# Patient Record
Sex: Female | Born: 1935 | Race: Black or African American | Hispanic: No | Smoking: Former smoker
Health system: Southern US, Community
[De-identification: ages and names within clinical notes are randomized; demographics above are authoritative.]

## PROBLEM LIST (undated history)

## (undated) DIAGNOSIS — E7800 Pure hypercholesterolemia, unspecified: Secondary | ICD-10-CM

## (undated) DIAGNOSIS — E039 Hypothyroidism, unspecified: Secondary | ICD-10-CM

## (undated) DIAGNOSIS — R413 Other amnesia: Secondary | ICD-10-CM

## (undated) DIAGNOSIS — K219 Gastro-esophageal reflux disease without esophagitis: Secondary | ICD-10-CM

## (undated) DIAGNOSIS — I10 Essential (primary) hypertension: Secondary | ICD-10-CM

## (undated) DIAGNOSIS — F329 Major depressive disorder, single episode, unspecified: Secondary | ICD-10-CM

## (undated) DIAGNOSIS — F32A Depression, unspecified: Secondary | ICD-10-CM

---

## 1957-03-24 HISTORY — PX: TONSILLECTOMY: SUR1361

## 1973-03-24 HISTORY — PX: TUBAL LIGATION: SHX77

## 1988-03-24 HISTORY — PX: FOOT SURGERY: SHX648

## 1989-03-24 HISTORY — PX: ROTATOR CUFF REPAIR: SHX139

## 1999-03-25 HISTORY — PX: ABDOMINAL HYSTERECTOMY: SHX81

## 1999-03-25 HISTORY — PX: ROTATOR CUFF REPAIR: SHX139

## 1999-04-26 ENCOUNTER — Encounter (INDEPENDENT_AMBULATORY_CARE_PROVIDER_SITE_OTHER): Payer: Self-pay | Admitting: Specialist

## 1999-04-26 ENCOUNTER — Ambulatory Visit (HOSPITAL_COMMUNITY): Admission: RE | Admit: 1999-04-26 | Discharge: 1999-04-26 | Payer: Self-pay | Admitting: Gastroenterology

## 1999-09-16 ENCOUNTER — Encounter: Payer: Self-pay | Admitting: Obstetrics and Gynecology

## 1999-09-18 ENCOUNTER — Inpatient Hospital Stay (HOSPITAL_COMMUNITY): Admission: RE | Admit: 1999-09-18 | Discharge: 1999-09-19 | Payer: Self-pay | Admitting: Obstetrics and Gynecology

## 1999-09-18 ENCOUNTER — Encounter (INDEPENDENT_AMBULATORY_CARE_PROVIDER_SITE_OTHER): Payer: Self-pay | Admitting: Specialist

## 1999-12-20 ENCOUNTER — Encounter: Admission: RE | Admit: 1999-12-20 | Discharge: 1999-12-20 | Payer: Self-pay | Admitting: Obstetrics and Gynecology

## 1999-12-20 ENCOUNTER — Encounter: Payer: Self-pay | Admitting: Obstetrics and Gynecology

## 2001-01-25 ENCOUNTER — Encounter: Payer: Self-pay | Admitting: Obstetrics and Gynecology

## 2001-01-25 ENCOUNTER — Encounter: Admission: RE | Admit: 2001-01-25 | Discharge: 2001-01-25 | Payer: Self-pay | Admitting: Obstetrics and Gynecology

## 2001-03-09 ENCOUNTER — Encounter: Admission: RE | Admit: 2001-03-09 | Discharge: 2001-03-09 | Payer: Self-pay | Admitting: Internal Medicine

## 2001-03-09 ENCOUNTER — Encounter: Payer: Self-pay | Admitting: Internal Medicine

## 2001-03-12 ENCOUNTER — Encounter: Payer: Self-pay | Admitting: Internal Medicine

## 2001-03-12 ENCOUNTER — Encounter: Admission: RE | Admit: 2001-03-12 | Discharge: 2001-03-12 | Payer: Self-pay | Admitting: Internal Medicine

## 2002-02-08 ENCOUNTER — Encounter: Payer: Self-pay | Admitting: Obstetrics and Gynecology

## 2002-02-08 ENCOUNTER — Encounter: Admission: RE | Admit: 2002-02-08 | Discharge: 2002-02-08 | Payer: Self-pay | Admitting: Obstetrics and Gynecology

## 2002-02-23 ENCOUNTER — Encounter: Admission: RE | Admit: 2002-02-23 | Discharge: 2002-02-23 | Payer: Self-pay | Admitting: Obstetrics and Gynecology

## 2002-02-23 ENCOUNTER — Encounter: Payer: Self-pay | Admitting: Obstetrics and Gynecology

## 2003-05-01 ENCOUNTER — Encounter: Admission: RE | Admit: 2003-05-01 | Discharge: 2003-05-01 | Payer: Self-pay | Admitting: Obstetrics and Gynecology

## 2003-07-25 ENCOUNTER — Encounter (INDEPENDENT_AMBULATORY_CARE_PROVIDER_SITE_OTHER): Payer: Self-pay | Admitting: Specialist

## 2003-07-25 ENCOUNTER — Ambulatory Visit (HOSPITAL_COMMUNITY): Admission: RE | Admit: 2003-07-25 | Discharge: 2003-07-25 | Payer: Self-pay | Admitting: Gastroenterology

## 2003-08-02 ENCOUNTER — Ambulatory Visit (HOSPITAL_BASED_OUTPATIENT_CLINIC_OR_DEPARTMENT_OTHER): Admission: RE | Admit: 2003-08-02 | Discharge: 2003-08-02 | Payer: Self-pay | Admitting: Pulmonary Disease

## 2003-12-22 ENCOUNTER — Encounter (INDEPENDENT_AMBULATORY_CARE_PROVIDER_SITE_OTHER): Payer: Self-pay | Admitting: *Deleted

## 2003-12-22 ENCOUNTER — Ambulatory Visit (HOSPITAL_COMMUNITY): Admission: RE | Admit: 2003-12-22 | Discharge: 2003-12-23 | Payer: Self-pay | Admitting: Surgery

## 2004-03-11 ENCOUNTER — Ambulatory Visit: Payer: Self-pay | Admitting: Pulmonary Disease

## 2004-03-11 ENCOUNTER — Encounter: Payer: Self-pay | Admitting: Pulmonary Disease

## 2004-05-15 ENCOUNTER — Encounter: Admission: RE | Admit: 2004-05-15 | Discharge: 2004-05-15 | Payer: Self-pay | Admitting: Obstetrics and Gynecology

## 2004-05-15 ENCOUNTER — Emergency Department (HOSPITAL_COMMUNITY): Admission: EM | Admit: 2004-05-15 | Discharge: 2004-05-15 | Payer: Self-pay | Admitting: Emergency Medicine

## 2004-05-21 ENCOUNTER — Ambulatory Visit (HOSPITAL_COMMUNITY): Admission: RE | Admit: 2004-05-21 | Discharge: 2004-05-21 | Payer: Self-pay | Admitting: Orthopaedic Surgery

## 2004-05-24 ENCOUNTER — Encounter: Admission: RE | Admit: 2004-05-24 | Discharge: 2004-05-24 | Payer: Self-pay | Admitting: Obstetrics and Gynecology

## 2004-06-03 ENCOUNTER — Ambulatory Visit: Payer: Self-pay | Admitting: Pulmonary Disease

## 2005-08-25 ENCOUNTER — Encounter: Admission: RE | Admit: 2005-08-25 | Discharge: 2005-08-25 | Payer: Self-pay | Admitting: Obstetrics and Gynecology

## 2006-08-27 ENCOUNTER — Encounter: Admission: RE | Admit: 2006-08-27 | Discharge: 2006-08-27 | Payer: Self-pay | Admitting: Obstetrics and Gynecology

## 2007-08-25 ENCOUNTER — Encounter: Payer: Self-pay | Admitting: Pulmonary Disease

## 2007-09-01 ENCOUNTER — Ambulatory Visit: Payer: Self-pay | Admitting: Pulmonary Disease

## 2007-09-01 DIAGNOSIS — G4730 Sleep apnea, unspecified: Secondary | ICD-10-CM | POA: Insufficient documentation

## 2007-09-01 DIAGNOSIS — J4489 Other specified chronic obstructive pulmonary disease: Secondary | ICD-10-CM | POA: Insufficient documentation

## 2007-09-01 DIAGNOSIS — J45909 Unspecified asthma, uncomplicated: Secondary | ICD-10-CM | POA: Insufficient documentation

## 2007-09-01 DIAGNOSIS — J449 Chronic obstructive pulmonary disease, unspecified: Secondary | ICD-10-CM | POA: Insufficient documentation

## 2007-09-02 ENCOUNTER — Encounter: Admission: RE | Admit: 2007-09-02 | Discharge: 2007-09-02 | Payer: Self-pay | Admitting: Obstetrics and Gynecology

## 2007-09-14 ENCOUNTER — Telehealth (INDEPENDENT_AMBULATORY_CARE_PROVIDER_SITE_OTHER): Payer: Self-pay | Admitting: *Deleted

## 2007-09-29 ENCOUNTER — Ambulatory Visit (HOSPITAL_COMMUNITY): Admission: RE | Admit: 2007-09-29 | Discharge: 2007-09-29 | Payer: Self-pay | Admitting: Internal Medicine

## 2008-09-13 ENCOUNTER — Encounter: Admission: RE | Admit: 2008-09-13 | Discharge: 2008-09-13 | Payer: Self-pay | Admitting: Internal Medicine

## 2009-04-05 ENCOUNTER — Ambulatory Visit (HOSPITAL_COMMUNITY): Admission: RE | Admit: 2009-04-05 | Discharge: 2009-04-05 | Payer: Self-pay | Admitting: Internal Medicine

## 2010-01-03 ENCOUNTER — Encounter
Admission: RE | Admit: 2010-01-03 | Discharge: 2010-01-03 | Payer: Self-pay | Source: Home / Self Care | Admitting: Internal Medicine

## 2010-04-12 ENCOUNTER — Ambulatory Visit (HOSPITAL_COMMUNITY)
Admission: RE | Admit: 2010-04-12 | Discharge: 2010-04-12 | Payer: Self-pay | Source: Home / Self Care | Attending: Internal Medicine | Admitting: Internal Medicine

## 2010-04-13 ENCOUNTER — Encounter: Payer: Self-pay | Admitting: Obstetrics and Gynecology

## 2010-04-14 ENCOUNTER — Encounter: Payer: Self-pay | Admitting: Obstetrics and Gynecology

## 2010-08-09 NOTE — Op Note (Signed)
[facility]  Patient:    Haley Sanders, Haley Sanders                    MRN: [id number] Proc. Date: 09/18/99 Adm. Date:  [id number] Attending:  Lendon Colonel                           Operative Report  PREOPERATIVE DIAGNOSES: 1. Pelvic pain. 2. Uterine fibroids.  POSTOPERATIVE DIAGNOSES: 1. Pelvic pain. 2. Uterine fibroids.  OPERATION/PROCEDURE: Laparoscopically assisted vaginal hysterectomy, bilateral salpingo-oophorectomy.  ANESTHESIA: General endotracheal.  SURGEON: S. Kyra Manges, M.D.  ASSISTANT:  DESCRIPTION OF PROCEDURE:  The patient was placed in the lithotomy position and prepped and draped in the usual sterile fashion. The cervix was grasped and the elevator was inserted into the uterus. A transverse incision was made in the umbilicus and the abdomen was distended with carbon dioxide using a Veress needle with aspiration infusion technique. Then 2.5 L of CO2 were infused. The trocar was inserted into the abdomen and visualization of the pelvis occurred. A #5 mm trocar was placed in the midline at the pelvis. A lateral 5 mm on each side was placed under direct vision. The forceps and grasping forceps were used to elevate the infundibulopelvic ligament to visualize the uterus medial to this and transact the uterine infundibulopelvic structures, the broad ligament and round ligament. This was done on each side. The bladder flap was created, pushed downward and the uterine vesicles were coagulated. We went down below and completed the vaginal hysterectomy by incising in the vagina, and circumscribing the cervix and entering the cul-de-sac posteriorly and clamping the uterosacral cardinals and the remaining portion of the broad ligament was clamped and sutured with 0 chromic. Hemostasis was secure. The uterosacral ligaments were very carefully plicated in the midline with interrupted sutures of 0 Vicryl. The peritoneum was closed with 2-0  PDS. Following this, the vagina was closed in a vertical fashion with interrupted sutures of 0 Vicryl and a continuous suture of the 2-0 PDS. Hemostasis was secure. Gas was evacuated and the Foley catheter was draining clear urine. We then went above and irrigated the pelvis and checked all pedicles and found them to be hemostatically secure. We then closed the trocar sites with 0 Vicryl and 3-0  Vicryl. The incisions, four of them, were infiltrated with 0.5% Marcaine. The patient tolerated the procedure well. Sent to the recovery room in good condition. DD:  09/18/99 TD:  09/19/99 Job: [id number]

## 2010-08-09 NOTE — Op Note (Signed)
NAME:  Haley Sanders, Haley Sanders                         ACCOUNT NO.:  [id number]   MEDICAL RECORD NO.:  [id number]                   PATIENT TYPE:  AMB   LOCATION:  ENDO                                 FACILITY:  [facility]   PHYSICIAN:  Petra Kuba, M.D.                 DATE OF BIRTH:  1932-05-20   DATE OF PROCEDURE:  DATE OF DISCHARGE:                                 OPERATIVE REPORT   PROCEDURE:  Colonoscopy with hot biopsy.   INDICATIONS:  Patient with a history of colon polyps, due for repeat  screening.   Consent was signed after risks, benefits, methods, options thoroughly  discussed multiple times in the past.   MEDICINES USED:  Demerol 60, Versed 7.   PROCEDURE:  Rectal inspection is pertinent for external hemorrhoids, small.  Digital exam was negative.  The video pediatric adjustable colonoscope was  inserted and fairly easily advanced around the colon to the cecum.  This did  require abdominal pressure and rolling her on her back.  No abnormality was  seen on insertion.  The cecum was identified by the appendiceal orifice and  the ileocecal valve.  The prep was fairly adequate with over a liter of  washing and suctioning.  Adequate visualization was obtained.  There was  some stool adherent to the wall, which could not be washed off.  On slow  withdrawal through the colon, two hepatic flexure polyps were seen and were  each hot-biopsied x1 and put in the first container.  The scope was slowly  withdrawn.  In the distal transverse, descending, sigmoid, and rectum were a  few tiny polyps, all of which were hot-biopsied and put in the same  container.  No other abnormalities were seen as we slowly withdrew back to  the rectum.  Anorectal pull-through and retroflexion confirmed some small  hemorrhoids.  The scope was reinserted a short ways up the left side of the  colon.  Air was suctioned.  The scope removed.  Patient tolerated the  procedure well.  There was no obvious immediate  complication.   DIAGNOSES:  1. Small internal and external hemorrhoids.  2. Multiple tiny polyps and hot biopsies, rectal, sigmoid, descending,     transverse and hepatic flexure.  3. Otherwise within normal limits to the cecum.   PLAN:  Await pathology to determine future colonic screening.  Happy to see  back p.r.n., otherwise return to the care of Dr. Felipa Eth for the customary  health care management, to include yearly rectals and guaiacs.                                               Petra Kuba, M.D.   MEM/MEDQ  D:  07/25/2003  T:  07/25/2003  Job:  [id number]  cc:   Larina Earthly, M.D.  [address]  [place]  [place] [address]  Fax: [phone]

## 2010-08-09 NOTE — Discharge Summary (Signed)
[facility]  Patient:    Haley Sanders, Haley Sanders                    MRN: [id number] Adm. Date:  [id number] Disc. Date: [id number] Attending:  Lendon Colonel                           Discharge Summary  ADMISSION DIAGNOSIS:  Uterine fibroid, pelvic pressure and discomfort.  DISCHARGE DIAGNOSIS:  Same.  OPERATION:  Laparoscopic-assisted vaginal hysterectomy, bilateral salpingo-oophorectomy.  BRIEF HISTORY:  Ms. Nistler is a 77 year old female who presents for laparoscopic-assisted hysterectomy and removal of both ovaries for pelvic pain and a large retroverted uterine fibroid.  She has been evaluated for other causes of her pelvic discomfort in this evaluation, including pelvic ultrasound and GI evaluation had been negative.  LABORATORY STUDIES:  Admission hemoglobin was 14.6, MCV was 93, coagulation profile and metabolic profile was normal.  Preoperative cardiogram and chest x-ray were likewise normal.  HOSPITAL COURSE:  The patient was admitted to the hospital; underwent an uneventful vaginal hysterectomy with removal of a 200-gram uterine fibroid. Both ovaries were completely normal.  Her postoperative course was uncomplicated.  She was discharged the following day to home and office care. She was asked to call for fever or bleeding or any other problem that she would encounter.  She was given a prescription for Percocet for pain and Restoril for sleep.  CONDITION ON DISCHARGE:  Improved. DD:  09/27/99 TD:  09/27/99 Job: [id number]

## 2010-08-09 NOTE — Procedures (Signed)
Haley Sanders. [facility]  Patient:    Haley Sanders                       MRN: [id number] Proc. Date: 04/26/99 Adm. Date:  [id number] Attending:  Nelda Marseille CC:         Petra Kuba, M.D.             Ravi R. Felipa Eth, M.D.             Katherine Roan, M.D.                           Procedure Report  PROCEDURE PERFORMED:  Colonoscopy with hot and snare polypectomy.  ENDOSCOPIST:  Petra Kuba, M.D.  INDICATIONS:  Patient with multiple GI complaints, increasing and decreasing, due for colonic screening.  Family history of colon cancer.  Consent was signed after risks, benefits, methods, and options were thoroughly discussed on multiple occasions.  MEDICATIONS USED:  Demerol 50 mg, Versed 7 mg.  DESCRIPTION OF PROCEDURE:  Rectal inspection was pertinent for external hemorrhoids.  Digital exam was negative.  Video pediatric colonoscope was inserted with some difficulty due to a tortuous looping colon and difficulty holding air.  I was able to advance to the cecum.  This did require multiple abdominal pressures and rolling her on her back.  The cecum was identified by the appendiceal orifice and ileocecal valve.  On insertion only some left-sided diverticula were seen. The prep on the right side was fair.  There was some stool that could be suctioned.  Some stuck to the wall of the colon.  The scope was slowly withdrawn.  No cecal, ascending or transverse abnormalities were seen.  In the middescending, a 3 mm polyp was seen, snared.  Unfortunately, we cut through the base and the polyp was suctioned through the scope and collected in a trap and the base was hot biopsied. There was no active bleeding after the hot biopsy.  The scope was further withdrawn.  Other than some left sided diverticula, no other abnormalities were  seen except for a tiny hyperplastic appearing proximal rectal polyp which was hot biopsied x 1.  Once back in the rectum,  anorectal pull-through and retroflexion  confirmed the hemorrhoids.  The scope was then straightened, readvanced a short  ways up the sigmoid.  Air was suctioned, the scope slowly withdrawn.  The patient tolerated the procedure well.  There was no obvious immediate complication.  ENDOSCOPIC DIAGNOSIS: 1. Internal and external hemorrhoids. 2. Looping colon with some difficulty holding air and fair prep on the right side. 3. Occasional left-sided diverticula. 4. Two tiny to small polyps, hot biopsied in the rectum, snare and hot biopsied    in the descending. 5. Otherwise within normal limits to the cecum.  PLAN:  Await pathology to determine future screening.  Would probably recheck in four to five years.  GI follow-up p.r.n. or in three to four months.  If her left lower quadrant pain continues, might consider an antispasmodic next.  Will put er on one week of postpolypectomy instructions.  Will need to check her previous colonoscopy reports but possibly she would do better with a regular scope instead of the peds which was used in this case since maybe if it was stiffer we would e able to control the loops better and with pressure. DD:  04/26/99 TD:  04/26/99 Job: [id number]

## 2010-08-09 NOTE — Op Note (Signed)
NAMELUCINA, Haley Sanders NO.:  [id number]   MEDICAL RECORD NO.:  [id number]          PATIENT TYPE:  OIB   LOCATION:  2899                         FACILITY:  [facility]   PHYSICIAN:  Vanita Panda. Magnus Ivan, M.D.DATE OF BIRTH:  1932/11/12   DATE OF PROCEDURE:  05/21/2004  DATE OF DISCHARGE:                                 OPERATIVE REPORT   PREOPERATIVE DIAGNOSIS:  Right hand fifth finger transverse fracture  proximal phalanx.   POSTOPERATIVE DIAGNOSIS:  Right hand fifth finger transverse fracture  proximal phalanx.   PROCEDURE:  Open reduction, percutaneous pinning of right fifth finger  proximal phalanx fracture.   SURGEON:  Vanita Panda. Magnus Ivan, M.D.   ANESTHESIA:  Axillary block with IV sedation.   COMPLICATIONS:  None.   TOURNIQUET TIME:  1 hour 24 minutes.   INDICATIONS:  Briefly, Ms. Minahan is a right hand dominant 77 year old  female who last week sustained a fall on her outstretched right hand when  she accidentally fell.  She landed on this hand and sustained a fracture to  her right fifth finger.  This is her dominant hand.  She was reviewed in the  [facility] and placed in a splint and given followup in my clinic.  I  saw her on Friday and assessed her x-rays as well.  She was found to have a  transverse fracture close to the base of the proximal phalanx.  Because of  the unstable nature of this fracture, it was recommended that she undergo  operative intervention.  The risks and benefits were explained to her, and  she agreed to proceed with surgery.   PROCEDURE DESCRIPTION:  After axillary block was obtained, she was brought  to the operating room and placed supine on the operating table, with her  right arm on the radiolucent arm table.  A nonsterile tourniquet was placed  on the upper arm, and then the arm was prepped and draped with DuraPrep and  draped with sterile drapes.  An Esmarch was used to wrap up the arm, and the  tourniquet  was inflated to 250 mm of pressure.  The finger was assessed  fluoroscopically, and it was definitely found to be an unstable fracture.  An incision was made over the dorsum of the proximal phalanx and carried  down to the extensor tendon.  The extensor tendon was divided longitudinally  right down the center of the tendon.  The fracture was exposed and cleaned  of fracture debris.  It was then held in a reduced position, and a single  0.45 mm Kirschner wire was then placed from the tip of the proximal phalanx  at the PIP joint in a retrograde fashion across the fracture and across the  MCP joint and out the middle phalanx.  It was then pulled out of the middle  phalanx further so it was not blocking the PIP joint.  Two cross pins were  then placed which were 0.32 mm K-wires in a crossing pattern across the  fracture site, as well as the finger was put through motion and found to be  held stable.  The wound was then cleaned, and the extensor tendon was  reapproximated with a 4-0 fiber wire suture.  The skin was then closed with  interrupted 4-0 nylon sutures.  The pins were cut outside the skin but close  to the skin and pin caps were then placed.  Tourniquet was deflated, and the  fingers did pink up nicely.  She was placed in a well-padded splint and  taken to the recovery room in stable  condition.  Postoperatively, the pins will stay in place a minimum of three  weeks to allow for fracture healing.  She will then begin range of motion of  the IP joint to that finger but will hold on any TI or MCP motion until the  pins are removed.  I will follow her closely in clinic.      CYB/MEDQ  D:  05/21/2004  T:  05/22/2004  Job:  [id number]

## 2010-08-09 NOTE — Op Note (Signed)
NAMEANGLEA, Haley NO.:  [id number]   MEDICAL RECORD NO.:  [id number]          PATIENT TYPE:  OIB   LOCATION:  5727                         FACILITY:  [facility]   PHYSICIAN:  Velora Heckler, M.D.   DATE OF BIRTH:  March 30, 1932   DATE OF PROCEDURE:  DATE OF DISCHARGE:                                 OPERATIVE REPORT   DATE OF SURGERY:  December 22, 2003.   PREOPERATIVE DIAGNOSIS:  Thyroid goiter with compressive symptoms.   POSTOPERATIVE DIAGNOSIS:  Thyroid goiter with compressive symptoms.   PROCEDURE:  Total thyroidectomy.   SURGEON:  Velora Heckler, MD.   ASSISTANT:  Gita Kudo, MD.   ANESTHESIA:  General.   ESTIMATED BLOOD LOSS:  Minimal.   PREPARATION:  Betadine.   COMPLICATIONS:  None.   INDICATIONS:  The patient is a 77 year old black female referred by Dr. Larina Earthly and Dr. Corliss Skains for thyroid goiter with compressive symptoms.  The patient has had a longstanding goiter for greater than 15 years.  She  has noted a marked increase in size over the past 3 to 4 years.  She is  treated for emphysema and asthmatic symptoms by Dr. Marcos Eke.  Dyspnea,  which has progressed recently, seems to be due in large part to upper airway  obstruction.  She complains of hoarseness and chronic cough.  She has a  pressure sensation in the neck.  Radiographic studies showed a large thyroid  gland with tracheal deviation.  The patient now comes to surgery for  thyroidectomy.   DESCRIPTION OF PROCEDURE:  Procedure is done in OR #17 at the [facility].  Patient is brought to the operating room and placed in  the supine position on the operating room table.  Following administration  of general anesthesia, the patient is positioned and then prepped and draped  in the usual strict aseptic fashion.  After ascertaining that an adequate  level of anesthesia had been obtained, a Kocher incision is made with a #10  blade.  Dissection is  carried down through skin and subcutaneous tissues.  Platysma is divided with the electrocautery, skin flaps are developed  cephalad and caudad from the thyroid notch to the sternal notch, and a  Mahorner self-retaining retractor is placed for exposure.  The thyroid gland  is markedly enlarged.  There is a very large pyramidal lobe.  Left lobe is  larger than the right.  Dissection is begun on the left side.  Strap muscles  are incised in the midline.  Strap muscles are reflected to the left.  Venous tributaries are divided between small and medium ligaclips.  The  gland is gently dissected out from the surrounding structures back to the  level of the cervical spine.  The gland extends beneath the left clavicle.  It extends well around the airway and the esophagus.  Inferior venous  tributaries are ligated in continuity with 2-0 silk ties and divided.  Both  the inferior and superior parathyroid glands on the left are identified and  preserved.  The superior pole vessels  are dissected out.  They are divided  between large ligaclips.  The bulk of the upper pole vasculature is ligated  in continuity with 2-0 silk ties and medium ligaclips, and divided.  The  gland is rolled anteriorly.  Branches of the inferior thyroid artery are  divided between small ligaclips.  Recurrent laryngeal nerve is preserved.  The ligament of Berry's is transected with the electrocautery, and the gland  is rolled medially onto the anterior surface of the trachea.  A large  pyramidal lobe is dissected off the underlying thyroid cartilage using the  electrocautery for hemostasis.   Next, we turned our attention to the right side of the neck.  Again, strap  muscles are reflected laterally.  The right lobe does not go substernal, but  does extend posteriorly to the cervical spine.  The superior pole extends  quite high in the right neck.  Venous tributaries are divided between medium  ligaclips.  Inferior venous  tributaries are ligated in continuity with 2-0  silk ties and divided.  The gland is rolled anteriorly.  The superior pole  vascular is divided between medium ligaclips.  Likewise, the bulk of the  superior pole vasculature is ligated in continuity with 2-0 silk ties and  divided.  The gland is rolled further anteriorly.  Branches of the inferior  thyroid artery are divided between small ligaclips.  The superior  parathyroid gland is identified and preserved.  Recurrent laryngeal nerve is  identified and preserved.  Branches of the inferior thyroid artery are  suture ligated with 3-0 silk suture ligature, and then the remaining  vasculature divided with the electrocautery as the gland is excised  completely off the anterior trachea.  The gland is quite large.  It is  marked with a suture in the right superior pole.  It is submitted in toto to  Pathology for review.  Good hemostasis is obtained in the neck with small  ligaclips.  The neck is irrigated with warm saline.  Surgicel is placed over  the area of the recurrent laryngeal nerves and parathyroid glands  bilaterally.  Strap muscles are reapproximated in the midline with  interrupted 3-0 Vicryl sutures.  Platysma is closed with interrupted 3-0  Vicryl sutures.  The skin is closed with a running 4-0 Vicryl subcuticular  suture.  The wound is washed and dried, and Steri-Strips are applied.  Sterile dressings are applied.  The patient is awakened from anesthesia and  brought to the recovery room in stable condition.  The patient tolerated the  procedure well.       TMG/MEDQ  D:  12/22/2003  T:  12/22/2003  Job:  [id number]   cc:   Larina Earthly, M.D.  [address]  [place]  [place] [address]  Fax: [phone]   Danice Goltz, M.D. [facility]

## 2010-08-09 NOTE — H&P (Signed)
[facility]  Patient:    Haley Sanders, Haley Sanders                   MRN: [id number] Adm. Date:  09/18/99 Attending:  Katherine Roan, M.D.                         History and Physical  CHIEF COMPLAINT:  Pelvic discomfort, pelvic pressure, pelvic pain.  HISTORY OF PRESENT ILLNESS:  Haley Sanders is a 77 year old gravida 6, para 3 female, who presents for a laparoscopic-assisted vaginal hysterectomy and removal of both ovaries because of pelvic pain and a fairly large retroverted uterine fibroid. She had been followed for years, and her uterus has not changed in size.  Her discomfort has gotten increasingly severe.  She has been seen by a gastroenterologist who found no GI disease of significance.  Because of the continued discomfort, a vaginal-assisted hysterectomy with the removal of both ovaries has been recommended.  Her Pap smears are normal.  Her last menstrual period was in 1991.  She is on hormone replacement therapy consisting of an Estraderm patch and Agestin.  She has had no period since 1991.  PAST MEDICAL HISTORY:  Her other comorbidities include an elevated cholesterol,  allergies, reflux disease, asthma, and hypertension, which are all controlled at this point.  PAST SURGICAL HISTORY: 1. Tonsillectomy at age 41. 2. Tubal ligation at age 73. 3. She had an arthroscopy in her shoulder. 4. Foot surgery in 1988.  ALLERGIES:  CODEINE.  REVIEW OF SYSTEMS:   HEENT:  She wears glasses.  No decrease in visual or auditory acuity.  HEART:  No history of rheumatic fever.  No history of chest pain.  She has hypertension, but it is well-controlled at this time on Verapamil and HCTZ. She denies exertional dyspnea.  LUNGS:  She has multiple allergies and has mild emphysema and chronic bronchitis.  She is treated with Flovent.  She uses Flonase nasal spray.  No current cough or productive sputum.  She has minimal shortness of breath.  GU:  No stress  incontinence or frequent urinary tract infections.  GI:  She has irritable bowel syndrome, but no other bowel habit change.  No melena, o weight loss.  MUSCLES/BONES/JOINTS:  She has arthritis.  SOCIAL HISTORY:  She is a retired Engineer, site.  FAMILY HISTORY:  Her mother died of possible ovarian cancer.  Father died with  CVA.  She has a paternal aunt with leukemia.  A maternal uncle with colon.  She has several aunts with breast cancer.  A paternal aunt with diabetes mellitus.  PHYSICAL EXAMINATION:  VITAL SIGNS:  Weight 161 pounds, blood pressure 150/88.  HEENT:  Unremarkable.  Oropharynx is not injected.  NECK:  Supple.  Thyroid is not enlarged.  Carotid pulses equal without bruits.  Trachea is in the midline.  LUNGS:  Clear to P&A.  BREASTS:  No masses or tenderness.  HEART:  Normal sinus rhythm.  No murmurs, no heaves, thrills, rubs, or gallops.  ABDOMEN:  Soft, nontender, without masses.  Bowel sounds are normal.  No bruits are heard.  EXTREMITIES:  Good range of motion and equal pulses and reflexes.  PELVIC:  Normal vulva and vagina.  The cervix is clean.  The uterus is retroverted about twice normal size, with no adnexal masses or tenderness.  The uterus appears to be somewhat fixed.  RECTOVAGINAL:  Examination confirms.  IMPRESSION:  Pelvic pain, pelvic discomfort, uterine enlargement, and  a family history of ovarian and breast cancer.  PLAN:  A laparoscopic-assisted vaginal hysterectomy, BSO. DD:  09/18/99 TD:  09/18/99 Job: [id number]

## 2010-12-25 ENCOUNTER — Other Ambulatory Visit: Payer: Self-pay | Admitting: Internal Medicine

## 2010-12-25 DIAGNOSIS — Z1231 Encounter for screening mammogram for malignant neoplasm of breast: Secondary | ICD-10-CM

## 2011-01-15 ENCOUNTER — Ambulatory Visit
Admission: RE | Admit: 2011-01-15 | Discharge: 2011-01-15 | Disposition: A | Discharge status: Home / Self Care | Payer: Medicare Other | Source: Ambulatory Visit | Attending: Internal Medicine | Admitting: Internal Medicine

## 2011-01-15 DIAGNOSIS — Z1231 Encounter for screening mammogram for malignant neoplasm of breast: Secondary | ICD-10-CM

## 2011-01-30 ENCOUNTER — Other Ambulatory Visit: Payer: Self-pay | Admitting: Gastroenterology

## 2011-02-05 ENCOUNTER — Ambulatory Visit
Admission: RE | Admit: 2011-02-05 | Discharge: 2011-02-05 | Disposition: A | Discharge status: Home / Self Care | Payer: Medicare Other | Source: Ambulatory Visit | Attending: Gastroenterology | Admitting: Gastroenterology

## 2011-02-07 ENCOUNTER — Other Ambulatory Visit: Payer: Self-pay | Admitting: Gastroenterology

## 2011-04-04 ENCOUNTER — Other Ambulatory Visit (HOSPITAL_COMMUNITY): Payer: Self-pay | Admitting: *Deleted

## 2011-04-22 ENCOUNTER — Encounter (HOSPITAL_COMMUNITY): Payer: Self-pay

## 2011-04-23 ENCOUNTER — Encounter (HOSPITAL_COMMUNITY): Payer: Self-pay

## 2011-04-23 ENCOUNTER — Encounter (HOSPITAL_COMMUNITY)
Admission: RE | Admit: 2011-04-23 | Discharge: 2011-04-23 | Disposition: A | Discharge status: Home / Self Care | Payer: Medicare Other | Source: Ambulatory Visit | Attending: Internal Medicine | Admitting: Internal Medicine

## 2011-04-23 DIAGNOSIS — M810 Age-related osteoporosis without current pathological fracture: Secondary | ICD-10-CM | POA: Insufficient documentation

## 2011-04-23 HISTORY — DX: Major depressive disorder, single episode, unspecified: F32.9

## 2011-04-23 HISTORY — DX: Gastro-esophageal reflux disease without esophagitis: K21.9

## 2011-04-23 HISTORY — DX: Other amnesia: R41.3

## 2011-04-23 HISTORY — DX: Essential (primary) hypertension: I10

## 2011-04-23 HISTORY — DX: Pure hypercholesterolemia, unspecified: E78.00

## 2011-04-23 HISTORY — DX: Hypothyroidism, unspecified: E03.9

## 2011-04-23 HISTORY — DX: Depression, unspecified: F32.A

## 2011-04-23 MED ORDER — ZOLEDRONIC ACID 5 MG/100ML IV SOLN
5.0000 mg | Freq: Once | INTRAVENOUS | Status: AC
  Administered 2011-04-23: 5 mg via INTRAVENOUS
  Filled 2011-04-23: qty 100

## 2011-04-23 MED ORDER — SODIUM CHLORIDE 0.9 % IV SOLN
Freq: Once | INTRAVENOUS | Status: AC
  Administered 2011-04-23: 11:00:00 via INTRAVENOUS

## 2011-06-11 ENCOUNTER — Other Ambulatory Visit (HOSPITAL_COMMUNITY)
Admission: RE | Admit: 2011-06-11 | Discharge: 2011-06-11 | Disposition: A | Discharge status: Home / Self Care | Payer: Medicare Other | Source: Ambulatory Visit | Attending: Obstetrics and Gynecology | Admitting: Obstetrics and Gynecology

## 2011-06-11 ENCOUNTER — Other Ambulatory Visit: Payer: Self-pay | Admitting: Obstetrics and Gynecology

## 2011-06-11 DIAGNOSIS — Z124 Encounter for screening for malignant neoplasm of cervix: Secondary | ICD-10-CM | POA: Insufficient documentation

## 2012-02-23 ENCOUNTER — Other Ambulatory Visit: Payer: Self-pay | Admitting: Internal Medicine

## 2012-02-23 DIAGNOSIS — Z1231 Encounter for screening mammogram for malignant neoplasm of breast: Secondary | ICD-10-CM

## 2012-04-06 ENCOUNTER — Ambulatory Visit
Admission: RE | Admit: 2012-04-06 | Discharge: 2012-04-06 | Disposition: A | Discharge status: Home / Self Care | Payer: Medicare Other | Source: Ambulatory Visit | Attending: Internal Medicine | Admitting: Internal Medicine

## 2012-04-06 DIAGNOSIS — Z1231 Encounter for screening mammogram for malignant neoplasm of breast: Secondary | ICD-10-CM

## 2013-04-13 ENCOUNTER — Other Ambulatory Visit: Payer: Self-pay

## 2013-04-13 DIAGNOSIS — Z1231 Encounter for screening mammogram for malignant neoplasm of breast: Secondary | ICD-10-CM

## 2013-05-06 ENCOUNTER — Ambulatory Visit: Payer: Medicare Other

## 2013-05-18 ENCOUNTER — Ambulatory Visit
Admission: RE | Admit: 2013-05-18 | Discharge: 2013-05-18 | Disposition: A | Discharge status: Home / Self Care | Payer: Medicare Other | Source: Ambulatory Visit

## 2013-05-18 DIAGNOSIS — Z1231 Encounter for screening mammogram for malignant neoplasm of breast: Secondary | ICD-10-CM

## 2014-07-17 ENCOUNTER — Other Ambulatory Visit: Payer: Self-pay

## 2014-07-17 DIAGNOSIS — Z1231 Encounter for screening mammogram for malignant neoplasm of breast: Secondary | ICD-10-CM

## 2014-07-28 ENCOUNTER — Ambulatory Visit
Admission: RE | Admit: 2014-07-28 | Discharge: 2014-07-28 | Disposition: A | Discharge status: Home / Self Care | Payer: Medicare Other | Source: Ambulatory Visit

## 2014-07-28 DIAGNOSIS — Z1231 Encounter for screening mammogram for malignant neoplasm of breast: Secondary | ICD-10-CM

## 2015-05-15 ENCOUNTER — Telehealth: Payer: Self-pay

## 2015-05-15 NOTE — Telephone Encounter (Signed)
Wrong pt- cm

## 2015-09-04 ENCOUNTER — Other Ambulatory Visit: Payer: Self-pay | Admitting: Internal Medicine

## 2015-09-04 DIAGNOSIS — Z1231 Encounter for screening mammogram for malignant neoplasm of breast: Secondary | ICD-10-CM

## 2015-09-26 ENCOUNTER — Other Ambulatory Visit (HOSPITAL_COMMUNITY): Payer: Self-pay | Admitting: Internal Medicine

## 2015-09-26 DIAGNOSIS — I5030 Unspecified diastolic (congestive) heart failure: Secondary | ICD-10-CM

## 2015-10-09 ENCOUNTER — Ambulatory Visit
Admission: RE | Admit: 2015-10-09 | Discharge: 2015-10-09 | Disposition: A | Discharge status: Home / Self Care | Payer: Medicare Other | Source: Ambulatory Visit | Attending: Internal Medicine | Admitting: Internal Medicine

## 2015-10-09 DIAGNOSIS — Z1231 Encounter for screening mammogram for malignant neoplasm of breast: Secondary | ICD-10-CM

## 2015-10-11 ENCOUNTER — Other Ambulatory Visit: Payer: Self-pay

## 2015-10-11 ENCOUNTER — Ambulatory Visit (HOSPITAL_COMMUNITY): Payer: Medicare Other | Attending: Cardiology

## 2015-10-11 DIAGNOSIS — I5031 Acute diastolic (congestive) heart failure: Secondary | ICD-10-CM | POA: Diagnosis present

## 2015-10-11 DIAGNOSIS — I358 Other nonrheumatic aortic valve disorders: Secondary | ICD-10-CM | POA: Insufficient documentation

## 2015-10-11 DIAGNOSIS — G4733 Obstructive sleep apnea (adult) (pediatric): Secondary | ICD-10-CM | POA: Diagnosis not present

## 2015-10-11 DIAGNOSIS — I517 Cardiomegaly: Secondary | ICD-10-CM | POA: Diagnosis not present

## 2015-10-11 DIAGNOSIS — J449 Chronic obstructive pulmonary disease, unspecified: Secondary | ICD-10-CM | POA: Insufficient documentation

## 2015-10-11 DIAGNOSIS — I5030 Unspecified diastolic (congestive) heart failure: Secondary | ICD-10-CM | POA: Insufficient documentation

## 2015-12-05 ENCOUNTER — Encounter: Payer: Self-pay | Admitting: Interventional Cardiology

## 2015-12-19 ENCOUNTER — Encounter: Payer: Self-pay | Admitting: Interventional Cardiology

## 2015-12-19 ENCOUNTER — Ambulatory Visit (INDEPENDENT_AMBULATORY_CARE_PROVIDER_SITE_OTHER): Payer: Medicare Other | Admitting: Interventional Cardiology

## 2015-12-19 VITALS — BP 150/79 | HR 71 | Ht 61.0 in | Wt 175.0 lb

## 2015-12-19 DIAGNOSIS — R0602 Shortness of breath: Secondary | ICD-10-CM

## 2015-12-19 DIAGNOSIS — I10 Essential (primary) hypertension: Secondary | ICD-10-CM | POA: Insufficient documentation

## 2015-12-19 DIAGNOSIS — I519 Heart disease, unspecified: Secondary | ICD-10-CM | POA: Diagnosis not present

## 2015-12-19 DIAGNOSIS — I5189 Other ill-defined heart diseases: Secondary | ICD-10-CM

## 2015-12-19 NOTE — Progress Notes (Signed)
Cardiology Office Note   Date:  12/19/2015   ID:  Haley Sanders, DOB 01-09-1933, MRN [id number]  PCP:  Hoyle SauerAVVA,RAVISANKAR R, MD    No chief complaint on file. SHOB   Wt Readings from Last 3 Encounters:  12/19/15 175 lb (79.4 kg)  04/23/11 177 lb (80.3 kg)  09/01/07 173 lb 2.1 oz (78.5 kg)       History of Present Illness: Haley Sanders is a 82 y.o. female  Who has noted fatigue and SHOB over the past few months.  Worse after she eats and then walks up stairs.  She notes some ankle swelling, worse at the end of the day.    No trouble lying flat.  Bed in angled for GERD.   No CP.  S[facility] at the [facility].  She does not achieve breathlessness with exertion.  She walks at a slower pace.  Overall, she feels that her stamina has been decreasing.     Past Medical History:  Diagnosis Date  . Depression   . Diabetes mellitus   . GERD (gastroesophageal reflux disease)   . Hypercholesterolemia   . Hypertension   . Hypothyroidism   . Memory deficit   . Osteoporosis     Past Surgical History:  Procedure Laterality Date  . ABDOMINAL HYSTERECTOMY  2001  . FOOT SURGERY  1990   right foot  . ROTATOR CUFF REPAIR  1991   right  . ROTATOR CUFF REPAIR  2001   left side  . TONSILLECTOMY  1959  . TUBAL LIGATION  1975     Current Outpatient Prescriptions  Medication Sig Dispense Refill  . albuterol (PROVENTIL HFA;VENTOLIN HFA) 108 (90 Base) MCG/ACT inhaler Inhale 2 puffs into the lungs every 6 (six) hours as needed for wheezing or shortness of breath.     Marland Kitchen. aspirin 81 MG tablet Take 160 mg by mouth daily.    Marland Kitchen. atorvastatin (LIPITOR) 40 MG tablet Take 40 mg by mouth daily.    . Azelastine HCl 0.15 % SOLN Place 2 sprays into the nose daily.     . budesonide-formoterol (SYMBICORT) 160-4.5 MCG/ACT inhaler Inhale 2 puffs into the lungs 2 (two) times daily.    Marland Kitchen. buPROPion (ZYBAN) 150 MG 12 hr tablet Take 150 mg by mouth 2 (two) times daily.    . cyanocobalamin 100  MCG tablet Take 100 mcg by mouth daily.    Marland Kitchen. donepezil (ARICEPT) 10 MG tablet Take 10 mg by mouth at bedtime as needed (MEMORY).     Marland Kitchen. esomeprazole (NEXIUM) 40 MG capsule Take 40 mg by mouth daily before breakfast.    . fish oil-omega-3 fatty acids 1000 MG capsule Take 2 g by mouth daily.    Marland Kitchen. FLUoxetine (PROZAC) 40 MG capsule Take 40 mg by mouth daily.    Marland Kitchen. levothyroxine (SYNTHROID, LEVOTHROID) 112 MCG tablet Take 112 mcg by mouth daily.    . meloxicam (MOBIC) 7.5 MG tablet Take 7.5 mg by mouth as needed for pain.     . montelukast (SINGULAIR) 10 MG tablet Take 10 mg by mouth at bedtime.    . ONE TOUCH ULTRA TEST test strip 1 each by Other route as needed for other.     . psyllium (REGULOID) 0.52 G capsule Take 0.52 g by mouth daily.    Marland Kitchen. telmisartan-hydrochlorothiazide (MICARDIS HCT) 80-25 MG tablet Take 1 tablet by mouth daily.     . verapamil (CALAN) 120 MG tablet Take 120 mg by mouth 3 (  three) times daily.    . VESICARE 5 MG tablet Take 5 mg by mouth daily.     . vitamin E 600 UNIT capsule Take 600 Units by mouth daily.     No current facility-administered medications for this visit.     Allergies:   Codeine    Social History:  The patient  reports that she quit smoking about 27 years ago. She has a 33.00 pack-year smoking history. She has never used smokeless tobacco. She reports that she does not drink alcohol or use drugs.   Family History:  The patient's family history includes Heart attack in her father, maternal grandfather, and paternal grandmother.    ROS:  Please see the history of present illness.   Otherwise, review of systems are positive for DOE with walking stairs.   All other systems are reviewed and negative.    PHYSICAL EXAM: VS:  BP (!) 150/79   Pulse 71   Ht 5\' 1"  (1.549 m)   Wt 175 lb (79.4 kg)   BMI 33.07 kg/m  , BMI Body mass index is 33.07 kg/m. GEN: Well nourished, well developed, in no acute distress  HEENT: normal  Neck: no JVD, carotid bruits, or  masses Cardiac: RRR; no murmurs, rubs, or gallops,no edema  Respiratory:  clear to auscultation bilaterally, normal work of breathing GI: soft, nontender, nondistended, + BS MS: no deformity or atrophy  Skin: warm and dry, no rash Neuro:  Strength and sensation are intact Psych: euthymic mood, full affect   EKG:   The ekg ordered today demonstrates NSR, PRWP, no ST segment changes   Recent Labs: No results found for requested labs within last 8760 hours.   Lipid Panel No results found for: CHOL, TRIG, HDL, CHOLHDL, VLDL, LDLCALC, LDLDIRECT   Other studies Reviewed: Additional studies/ records that were reviewed today with results demonstrating: 7/17 echo.   ASSESSMENT AND PLAN:  1. Diastolic dysfunction. She appears euvolemic. Continue current medicines.  Cardiac function based on echo appears appropriate for age. No angina. No further cardiac testing planned. 2. Hypertension: I stressed the importance of blood pressure control and salt restriction to avoid fluid overload. 3. Shortness of breath: Try to increase activity, both in duration and intensity of possible. This will help her stamina. Watch for signs of fluid overload. If she did develop swelling, would have to consider changing her diuretic from HCTZ to furosemide.   Current medicines are reviewed at length with the patient today.  The patient concerns regarding her medicines were addressed.  The following changes have been made:  No change  Labs/ tests ordered today include:  No orders of the defined types were placed in this encounter.   Recommend 150 minutes/week of aerobic exercise Low fat, low carb, high fiber diet recommended  Disposition:   FU prn   Signed, Lance Muss, MD  12/19/2015 11:32 AM    [facility] [address] [place], [place], [place]  [address] Phone: [phone]; Fax: [phone]

## 2015-12-19 NOTE — Patient Instructions (Signed)
Medication Instructions:  Same-no changes  Labwork: None  Testing/Procedures: None  Follow-Up: As needed     If you need a refill on your cardiac medications before your next appointment, please call your pharmacy.   

## 2016-05-29 ENCOUNTER — Institutional Professional Consult (permissible substitution): Payer: Medicare Other | Admitting: Pulmonary Disease

## 2016-07-23 ENCOUNTER — Encounter: Payer: Self-pay | Admitting: Pulmonary Disease

## 2016-07-23 ENCOUNTER — Ambulatory Visit (INDEPENDENT_AMBULATORY_CARE_PROVIDER_SITE_OTHER): Payer: Medicare Other | Admitting: Pulmonary Disease

## 2016-07-23 VITALS — BP 118/64 | HR 65 | Ht 61.0 in | Wt 174.2 lb

## 2016-07-23 DIAGNOSIS — J4520 Mild intermittent asthma, uncomplicated: Secondary | ICD-10-CM

## 2016-07-23 DIAGNOSIS — I519 Heart disease, unspecified: Secondary | ICD-10-CM | POA: Diagnosis not present

## 2016-07-23 DIAGNOSIS — J42 Unspecified chronic bronchitis: Secondary | ICD-10-CM

## 2016-07-23 DIAGNOSIS — I5189 Other ill-defined heart diseases: Secondary | ICD-10-CM

## 2016-07-23 NOTE — Progress Notes (Signed)
Subjective:    Patient ID: Haley Sanders, female    DOB: 1932/10/12, 83 y.o.   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  She reports dyspnea on exertion gradually increasing over the last 2 years. She was diagnosed with "COPD" around then and placed on Symbicort with rescue albuterol with symptomatic relief. She smoked about 30-pack-years before she quit in 1990. Surprisingly, I see a spirometry from 2009 which does not show any evidence of airway obstruction. She denies frequent wheezing or chest colds. She underwent cardiac evaluation, echo 09/2015 showed dynamic outflow obstruction, no mention of hypertrophic cardiomyopathy, with peak gradient of 38, RVSP could not be estimated  She has moderate obstructive sleep apnea diagnosed in 2005 and is maintained on CPAP since then with a full face mask, her last machine is about 81 years old  During an office visit in 05/2016 she was noted to be hypoxic and placed on oxygen. She is now on a portable concentrator and is compliant with this during the daytime and during sleep. She's planning to travel to [place] for a high school reunion and is wondering how she can take her oxygen with her.  Surprisingly oxygen saturation remained stable on room air even on ambulation with 3 laps around the office   Significant tests/ events reviewed  Spirometry 08/2007-ratio 83, FEV1 84%, FVC 79%  Spirometry 07/2016-poor effort, ratio 83, FEV1 115%, FVC 106%-normal  Past Medical History:  Diagnosis Date  . Depression   . Diabetes mellitus   . GERD (gastroesophageal reflux disease)   . Hypercholesterolemia   . Hypertension   . Hypothyroidism   . Memory deficit   . Osteoporosis      Past Surgical History:  Procedure Laterality Date  . ABDOMINAL HYSTERECTOMY  2001  . FOOT SURGERY  1990   right foot  . ROTATOR CUFF REPAIR  1991   right  . ROTATOR CUFF REPAIR  2001   left side  . TONSILLECTOMY  1959  . TUBAL LIGATION  1975    Allergies  Allergen Reactions  . Codeine Other (See Comments)    FEELS  EXTREMELY LOOPY    Social History   Social History  . Marital status: Divorced    Spouse name: N/A  . Number of children: N/A  . Years of education: N/A   Occupational History  . Not on file.   Social History Main Topics  . Smoking status: Former Smoker    Packs/day: 1.00    Years: 33.00    Quit date: 04/22/1988  . Smokeless tobacco: Never Used  . Alcohol use No  . Drug use: No  . Sexual activity: Not on file   Other Topics Concern  . Not on file   Social History Narrative  . No narrative on file      Family History  Problem Relation Age of Onset  . Heart attack Father   . Heart attack Maternal Grandfather   . Heart attack Paternal Grandmother      Review of Systems Constitutional: negative for anorexia, fevers and sweats  Eyes: negative for irritation, redness and visual disturbance  Ears, nose, mouth, throat, and face: negative for earaches, epistaxis, nasal congestion and sore throat  Respiratory: negative for cough,  sputum and wheezing  Cardiovascular: negative for chest pain, dyspnea, lower extremity edema, orthopnea, palpitations and syncope  Gastrointestinal: negative for abdominal pain, constipation, diarrhea, melena, nausea and vomiting  Genitourinary:negative for dysuria,  frequency and hematuria  Hematologic/lymphatic: negative for bleeding, easy bruising and lymphadenopathy  Musculoskeletal:negative for arthralgias, muscle weakness and stiff joints  Neurological: negative for coordination problems, gait problems, headaches and weakness  Endocrine: negative for diabetic symptoms including polydipsia, polyuria and weight loss     Objective:   Physical Exam  Gen. Pleasant, well-nourished, in no distress, normal affect ENT - no lesions, no post nasal drip Neck: No JVD, no thyromegaly, no carotid bruits Lungs: no  use of accessory muscles, no dullness to percussion, clear without rales or rhonchi  Cardiovascular: Rhythm regular, heart sounds  normal, no murmurs or gallops, no peripheral edema Abdomen: soft and non-tender, no hepatosplenomegaly, BS normal. Musculoskeletal: No deformities, no cyanosis or clubbing Neuro:  alert, non focal       Assessment & Plan:

## 2016-07-23 NOTE — Assessment & Plan Note (Signed)
Spirometry does not show any evidence of airway obstruction today-she certainly does not have COPD. I have asked her to stop taking Symbicort She'll continue to use albuterol on an as-needed basis.  She was able to maintain her oxygen saturation on ambulation 3 laps around the office today. As such I have asked her to stay off oxygen for longer periods. She will report back with one to 2 weeks if she is able to do this without worsening symptoms and we can safely discontinue oxygen. I'm not sure how to explain her episode of hypoxia in March 2018-she does not have any risk factors of VTE. We will review her chest x-ray obtained at PCP office.

## 2016-07-23 NOTE — Assessment & Plan Note (Signed)
Dynamic outflow obstruction was noted on neck: Without evidence of hypertrophic cardiomyopathy. Do not think this explains her hypoxia either. Her dyspnea may be related to this or perhaps due to deconditioning We will follow her again in 3 months

## 2016-07-23 NOTE — Patient Instructions (Signed)
Okay to stay off oxygen for longer periods of time -we can eventually discontinue oxygen  You do not have COPD- okay to stop taking Symbicort  Follow-up with cardiologist

## 2016-09-11 ENCOUNTER — Encounter: Payer: Self-pay | Admitting: Interventional Cardiology

## 2016-09-28 NOTE — Progress Notes (Deleted)
Cardiology Office Note   Date:  09/28/2016   ID:  Haley Sanders, DOB 05/16/1932, MRN [id number]  PCP:  Chilton Greathouse, MD    No chief complaint on file.    Wt Readings from Last 3 Encounters:  07/23/16 174 lb 3.2 oz (79 kg)  12/19/15 175 lb (79.4 kg)  04/23/11 177 lb (80.3 kg)       History of Present Illness: Haley Sanders is a 83 y.o. female  With diastolic dysfunction and HTN.      Past Medical History:  Diagnosis Date  . Depression   . Diabetes mellitus   . GERD (gastroesophageal reflux disease)   . Hypercholesterolemia   . Hypertension   . Hypothyroidism   . Memory deficit   . Osteoporosis     Past Surgical History:  Procedure Laterality Date  . ABDOMINAL HYSTERECTOMY  2001  . FOOT SURGERY  1990   right foot  . ROTATOR CUFF REPAIR  1991   right  . ROTATOR CUFF REPAIR  2001   left side  . TONSILLECTOMY  1959  . TUBAL LIGATION  1975     Current Outpatient Prescriptions  Medication Sig Dispense Refill  . albuterol (PROVENTIL HFA;VENTOLIN HFA) 108 (90 Base) MCG/ACT inhaler Inhale 2 puffs into the lungs every 6 (six) hours as needed for wheezing or shortness of breath.     Marland Kitchen aspirin 81 MG tablet Take 160 mg by mouth daily.    Marland Kitchen atorvastatin (LIPITOR) 40 MG tablet Take 40 mg by mouth daily.    . Azelastine HCl 0.15 % SOLN Place 2 sprays into the nose daily.     . budesonide-formoterol (SYMBICORT) 160-4.5 MCG/ACT inhaler Inhale 2 puffs into the lungs 2 (two) times daily.    Marland Kitchen buPROPion (ZYBAN) 150 MG 12 hr tablet Take 150 mg by mouth 2 (two) times daily.    . cyanocobalamin 100 MCG tablet Take 100 mcg by mouth daily.    Marland Kitchen donepezil (ARICEPT) 10 MG tablet Take 10 mg by mouth at bedtime as needed (MEMORY).     Marland Kitchen esomeprazole (NEXIUM) 40 MG capsule Take 40 mg by mouth daily before breakfast.    . fish oil-omega-3 fatty acids 1000 MG capsule Take 2 g by mouth daily.    Marland Kitchen FLUoxetine (PROZAC) 40 MG capsule Take 40 mg by mouth daily.    Marland Kitchen levothyroxine  (SYNTHROID, LEVOTHROID) 112 MCG tablet Take 112 mcg by mouth daily.    . meloxicam (MOBIC) 7.5 MG tablet Take 7.5 mg by mouth as needed for pain.     . montelukast (SINGULAIR) 10 MG tablet Take 10 mg by mouth at bedtime.    . ONE TOUCH ULTRA TEST test strip 1 each by Other route as needed for other.     . psyllium (REGULOID) 0.52 G capsule Take 0.52 g by mouth daily.    Marland Kitchen telmisartan-hydrochlorothiazide (MICARDIS HCT) 80-25 MG tablet Take 1 tablet by mouth daily.     . verapamil (CALAN) 120 MG tablet Take 120 mg by mouth 3 (three) times daily.    . VESICARE 5 MG tablet Take 5 mg by mouth daily.     . vitamin E 600 UNIT capsule Take 600 Units by mouth daily.     No current facility-administered medications for this visit.     Allergies:   Codeine    Social History:  The patient  reports that she quit smoking about 28 years ago. She has a 33.00 pack-year smoking history.  She has never used smokeless tobacco. She reports that she does not drink alcohol or use drugs.   Family History:  The patient's ***family history includes Heart attack in her father, maternal grandfather, and paternal grandmother.    ROS:  Please see the history of present illness.   Otherwise, review of systems are positive for ***.   All other systems are reviewed and negative.    PHYSICAL EXAM: VS:  There were no vitals taken for this visit. , BMI There is no height or weight on file to calculate BMI. GEN: Well nourished, well developed, in no acute distress  HEENT: normal  Neck: no JVD, carotid bruits, or masses Cardiac: ***RRR; no murmurs, rubs, or gallops,no edema  Respiratory:  clear to auscultation bilaterally, normal work of breathing GI: soft, nontender, nondistended, + BS MS: no deformity or atrophy  Skin: warm and dry, no rash Neuro:  Strength and sensation are intact Psych: euthymic mood, full affect   EKG:   The ekg ordered today demonstrates ***   Recent Labs: No results found for requested  labs within last 8760 hours.   Lipid Panel No results found for: CHOL, TRIG, HDL, CHOLHDL, VLDL, LDLCALC, LDLDIRECT   Other studies Reviewed: Additional studies/ records that were reviewed today with results demonstrating: ***.   ASSESSMENT AND PLAN:  1. Diastolic dysfunction:  2. SHOB: 3. HTN:   Current medicines are reviewed at length with the patient today.  The patient concerns regarding her medicines were addressed.  The following changes have been made:  No change***  Labs/ tests ordered today include: *** No orders of the defined types were placed in this encounter.   Recommend 150 minutes/week of aerobic exercise Low fat, low carb, high fiber diet recommended  Disposition:   FU in ***   Signed, Lance MussJayadeep Willisha Sligar, MD  09/28/2016 2:42 PM    [facility] [address], [place], [place]  [address] Phone: [phone]; Fax: [phone]

## 2016-09-29 ENCOUNTER — Ambulatory Visit: Payer: Medicare Other | Admitting: Interventional Cardiology

## 2016-10-06 ENCOUNTER — Encounter: Payer: Self-pay | Admitting: Interventional Cardiology

## 2016-10-06 ENCOUNTER — Ambulatory Visit (INDEPENDENT_AMBULATORY_CARE_PROVIDER_SITE_OTHER): Payer: Medicare Other | Admitting: Interventional Cardiology

## 2016-10-06 ENCOUNTER — Encounter (INDEPENDENT_AMBULATORY_CARE_PROVIDER_SITE_OTHER): Payer: Self-pay

## 2016-10-06 VITALS — BP 140/64 | HR 70 | Ht 61.25 in | Wt 176.8 lb

## 2016-10-06 DIAGNOSIS — I421 Obstructive hypertrophic cardiomyopathy: Secondary | ICD-10-CM | POA: Diagnosis not present

## 2016-10-06 DIAGNOSIS — R0602 Shortness of breath: Secondary | ICD-10-CM

## 2016-10-06 DIAGNOSIS — I119 Hypertensive heart disease without heart failure: Secondary | ICD-10-CM

## 2016-10-06 DIAGNOSIS — I519 Heart disease, unspecified: Secondary | ICD-10-CM | POA: Diagnosis not present

## 2016-10-06 DIAGNOSIS — I5189 Other ill-defined heart diseases: Secondary | ICD-10-CM

## 2016-10-06 NOTE — Progress Notes (Signed)
Cardiology Office Note   Date:  10/06/2016   ID:  Haley Sanders, DOB 02/13/1933, MRN [id number]  PCP:  Chilton Greathouse, MD    Chief Complaint  Patient presents with  . Follow-up  diastolic heart failure   Wt Readings from Last 3 Encounters:  10/06/16 176 lb 12.8 oz (80.2 kg)  07/23/16 174 lb 3.2 oz (79 kg)  12/19/15 175 lb (79.4 kg)       History of Present Illness: Haley Sanders is a 83 y.o. female  Who has a h/o chronic diastolic heart failure.  She had normal LV function.    She was on oxygen for a while as well.  She was taken off of oxygen.  Despite SHOB, her oxygen sats have been controlled per her report.    She still tires easily with exertion.  She did mention that her oxygen sat  Dropped to 84% at that time a few weeks ago. THis was off of supplemental oxygen.    BP control has been ok per her report.  130-140 systolic per her report.   Denies : Chest pain. Dizziness. Nitroglycerin use. Orthopnea. Palpitations. Paroxysmal nocturnal dyspnea.  Syncope.   Some left ankle edema.  She had what sounds like ABIs per the [employer] nurse.      Past Medical History:  Diagnosis Date  . Depression   . Diabetes mellitus   . GERD (gastroesophageal reflux disease)   . Hypercholesterolemia   . Hypertension   . Hypothyroidism   . Memory deficit   . Osteoporosis     Past Surgical History:  Procedure Laterality Date  . ABDOMINAL HYSTERECTOMY  2001  . FOOT SURGERY  1990   right foot  . ROTATOR CUFF REPAIR  1991   right  . ROTATOR CUFF REPAIR  2001   left side  . TONSILLECTOMY  1959  . TUBAL LIGATION  1975     Current Outpatient Prescriptions  Medication Sig Dispense Refill  . albuterol (PROVENTIL HFA;VENTOLIN HFA) 108 (90 Base) MCG/ACT inhaler Inhale 2 puffs into the lungs every 6 (six) hours as needed for wheezing or shortness of breath.     Marland Kitchen aspirin 81 MG tablet Take 81 mg by mouth daily.     Marland Kitchen atorvastatin (LIPITOR) 40 MG tablet Take 40  mg by mouth daily.    . Azelastine HCl 0.15 % SOLN Place 2 sprays into the nose daily.     Marland Kitchen buPROPion (ZYBAN) 150 MG 12 hr tablet Take 150 mg by mouth 2 (two) times daily.    . cyanocobalamin 100 MCG tablet Take 100 mcg by mouth daily.    Marland Kitchen donepezil (ARICEPT) 10 MG tablet Take 10 mg by mouth at bedtime as needed (MEMORY).     Marland Kitchen esomeprazole (NEXIUM) 40 MG capsule Take 40 mg by mouth daily before breakfast.    . fish oil-omega-3 fatty acids 1000 MG capsule Take 2 g by mouth daily.    Marland Kitchen FLUoxetine (PROZAC) 40 MG capsule Take 40 mg by mouth daily.    Marland Kitchen levothyroxine (SYNTHROID, LEVOTHROID) 112 MCG tablet Take 112 mcg by mouth daily.    . meloxicam (MOBIC) 7.5 MG tablet Take 7.5 mg by mouth daily as needed for pain.     . mirabegron ER (MYRBETRIQ) 50 MG TB24 tablet Take 50 mg by mouth daily.    . montelukast (SINGULAIR) 10 MG tablet Take 10 mg by mouth at bedtime.    . ONE TOUCH ULTRA TEST test strip  1 each by Other route as needed for other.     . psyllium (REGULOID) 0.52 G capsule Take 0.52 g by mouth daily.    Marland Kitchen. telmisartan-hydrochlorothiazide (MICARDIS HCT) 80-25 MG tablet Take 1 tablet by mouth daily.     . verapamil (CALAN) 120 MG tablet Take 120 mg by mouth 2 (two) times daily.     . vitamin E 600 UNIT capsule Take 600 Units by mouth daily.     No current facility-administered medications for this visit.     Allergies:   Codeine    Social History:  The patient  reports that she quit smoking about 28 years ago. She has a 33.00 pack-year smoking history. She has never used smokeless tobacco. She reports that she does not drink alcohol or use drugs.   Family History:  The patient's family history includes Heart attack in her father, maternal grandfather, and paternal grandmother.    ROS:  Please see the history of present illness.   Otherwise, review of systems are positive for occasional edema, chronic SHOB.   All other systems are reviewed and negative.    PHYSICAL EXAM: VS:  BP  140/64   Pulse 70   Ht 5' 1.25" (1.556 m)   Wt 176 lb 12.8 oz (80.2 kg)   BMI 33.13 kg/m  , BMI Body mass index is 33.13 kg/m. GEN: Well nourished, well developed, in no acute distress  HEENT: normal  Neck: no JVD, carotid bruits, or masses Cardiac: RRR;1/6 systolic heart murmur, no rubs, or gallops,no edema  Respiratory:  clear to auscultation bilaterally, normal work of breathing GI: soft, nontender, nondistended, + BS MS: no deformity or atrophy  Skin: warm and dry, no rash Neuro:  Strength and sensation are intact Psych: euthymic mood, full affect   EKG:   The ekg ordered today demonstrates NSR, nonspecific ST segment changes   Recent Labs: No results found for requested labs within last 8760 hours.   Lipid Panel No results found for: CHOL, TRIG, HDL, CHOLHDL, VLDL, LDLCALC, LDLDIRECT   Other studies Reviewed: Additional studies/ records that were reviewed today with results demonstrating: 2017 showing mild LV gradient: .   ASSESSMENT AND PLAN:  1. Chronic diastolic heart failure: She appears euvolemic. Continue current medicines.   2. SHOB: I think this is related more to deconditioning. I encouraged her to try to be more active. She does have an oxygen saturation monitor at home. She reports some low readings at times when she is short of breath. She was on supplemental oxygen and still has it at home but is not using it. I also encouraged her to try to walk and wear the monitor to see how low her oxygen is dropping. 3. Continue BP control meds for hypertensive heart disease.  4. HOCM: 38 mm Hg gradient.  This is likely the cause of her murmur. THis contributes to her diastolic dysfunction as well.    Current medicines are reviewed at length with the patient today.  The patient concerns regarding her medicines were addressed.  The following changes have been made:  No change  Labs/ tests ordered today include:  No orders of the defined types were placed in this  encounter.   Recommend 150 minutes/week of aerobic exercise Low fat, low carb, high fiber diet recommended  Disposition:   FU in 9 months   Signed, Lance MussJayadeep Varanasi, MD  10/06/2016 2:35 PM    [facility] [address], [place], [place]  [address] Phone: [phone]; Fax: [phone]

## 2016-10-06 NOTE — Patient Instructions (Signed)
Medication Instructions:  Your physician recommends that you continue on your current medications as directed. Please refer to the Current Medication list given to you today.   Labwork: None ordered  Testing/Procedures: None ordered  Follow-Up: Your physician wants you to follow-up in: 9 month with Dr. Eldridge DaceVaranasi. You will receive a reminder letter in the mail two months in advance. If you don't receive a letter, please call our office to schedule the follow-up appointment.   Any Other Special Instructions Will Be Listed Below (If Applicable).     If you need a refill on your cardiac medications before your next appointment, please call your pharmacy.

## 2016-10-20 ENCOUNTER — Telehealth: Payer: Self-pay | Admitting: Interventional Cardiology

## 2016-10-20 ENCOUNTER — Other Ambulatory Visit: Payer: Self-pay | Admitting: Internal Medicine

## 2016-10-20 DIAGNOSIS — Z1231 Encounter for screening mammogram for malignant neoplasm of breast: Secondary | ICD-10-CM

## 2016-10-20 NOTE — Telephone Encounter (Signed)
Patient's daughter Myrene BuddyYvonne calling and wanting to know if her mother would be able to have a colonoscopy and shoulder surgery. She states that there is nothing scheduled at this time. I explained to the daughter that usually we will receive a fax requesting clearance from whoever is wanting to to do surgery and that the MD would send information back to them with the patient's risks/benefits from a cardiac standpoint and then it would be at the discretion of the surgeon on whether or not the patient should proceed. Daughter is wanting to know what her mother's risks are. Please advise.

## 2016-10-20 NOTE — Telephone Encounter (Signed)
Patient daughter Myrene Buddy(Yvonne) calling, states that she has some questions regarding patient.  Myrene BuddyYvonne would like to discuss limitations for a heart valve Myrene BuddyYvonne would also like to know if it is safe to have colonoscopy and shoulder surgery ( have not been scheduled yet)?

## 2016-10-21 NOTE — Telephone Encounter (Signed)
Patient's heart appears to be strong enough for both procedures.  Her recovery will most likely depend on her overall condition and stamina based on her ability to be active, but from a purely cardiac standpoint, I think her heart would be ok for these procedures.

## 2016-10-21 NOTE — Telephone Encounter (Signed)
Daughter made aware 

## 2016-10-24 ENCOUNTER — Ambulatory Visit (INDEPENDENT_AMBULATORY_CARE_PROVIDER_SITE_OTHER): Payer: Medicare Other | Admitting: Adult Health

## 2016-10-24 ENCOUNTER — Encounter: Payer: Self-pay | Admitting: Adult Health

## 2016-10-24 DIAGNOSIS — I519 Heart disease, unspecified: Secondary | ICD-10-CM | POA: Diagnosis not present

## 2016-10-24 DIAGNOSIS — J4520 Mild intermittent asthma, uncomplicated: Secondary | ICD-10-CM

## 2016-10-24 DIAGNOSIS — G4733 Obstructive sleep apnea (adult) (pediatric): Secondary | ICD-10-CM

## 2016-10-24 DIAGNOSIS — I5189 Other ill-defined heart diseases: Secondary | ICD-10-CM

## 2016-10-24 NOTE — Addendum Note (Signed)
Addended by: Boone MasterJONES, Sherrelle Prochazka E on: 10/24/2016 01:06 PM   Modules accepted: Orders

## 2016-10-24 NOTE — Progress Notes (Signed)
 @Patient  ID: Haley Sanders, female    DOB: [birth year], 83 y.o.   MRN: [id number]  Chief Complaint  Patient presents with  . Follow-up    Referring provider: Chilton GreathouseAvva, Ravisankar, MD  HPI: 83 year old female former smoker seen for pulmonary consult May 2018 for hypoxia .   TEST  Significant tests/ events reviewed  Spirometry 08/2007-ratio 83, FEV1 84%, FVC 79%  Spirometry 07/2016-poor effort, ratio 83, FEV1 115%, FVC 106%-normal 07/2016 walk test no desats.   10/24/2016 Follow up : Hypoxia Patient returns for a three-month follow-up. Patient was seen last visit for a pulmonary consult. Patient had been having worsening shortness of breath over the last 2 years. She was diagnosed with COPD and started on Symbicort. Spirometry was done last visit with no evidence of airflow obstruction or restriction. Patient had been started on oxygen and March 2018 for some hypoxemia. Walk test in the office May 2018 showed no evidence of desaturations. Patient was recommended to stop Symbicort.. She was told that if her oxygen level stayed above 88%. She did not need oxygen. She did not notice any change in her breathing off Symbicort .has been checking her oxygen level at home averaging 90-95% on room air .  She is on CPAP At bedtime  W/ oxygen . No recent download.  Denies chest pain , orthopnea, edema or fever.        Allergies  Allergen Reactions  . Codeine Other (See Comments)    FEELS  EXTREMELY LOOPY    Immunization History  Administered Date(s) Administered  . Influenza-Unspecified 12/23/2015  . Pneumococcal-Unspecified 10/25/2014    Past Medical History:  Diagnosis Date  . Depression   . Diabetes mellitus   . GERD (gastroesophageal reflux disease)   . Hypercholesterolemia   . Hypertension   . Hypothyroidism   . Memory deficit   . Osteoporosis     Tobacco History: History  Smoking Status  . Former Smoker  . Packs/day: 1.00  . Years: 33.00  . Quit date: 04/22/1988    Smokeless Tobacco  . Never Used   Counseling given: Not Answered   Outpatient Encounter Prescriptions as of 10/24/2016  Medication Sig  . albuterol (PROVENTIL HFA;VENTOLIN HFA) 108 (90 Base) MCG/ACT inhaler Inhale 2 puffs into the lungs every 6 (six) hours as needed for wheezing or shortness of breath.   Marland Kitchen. aspirin 81 MG tablet Take 81 mg by mouth daily.   Marland Kitchen. atorvastatin (LIPITOR) 40 MG tablet Take 40 mg by mouth daily.  . Azelastine HCl 0.15 % SOLN Place 2 sprays into the nose daily.   Marland Kitchen. buPROPion (ZYBAN) 150 MG 12 hr tablet Take 150 mg by mouth 2 (two) times daily.  . cyanocobalamin 100 MCG tablet Take 100 mcg by mouth daily.  Marland Kitchen. donepezil (ARICEPT) 10 MG tablet Take 10 mg by mouth at bedtime as needed (MEMORY).   Marland Kitchen. esomeprazole (NEXIUM) 40 MG capsule Take 40 mg by mouth daily before breakfast.  . fish oil-omega-3 fatty acids 1000 MG capsule Take 2 g by mouth daily.  Marland Kitchen. FLUoxetine (PROZAC) 40 MG capsule Take 40 mg by mouth daily.  . meloxicam (MOBIC) 7.5 MG tablet Take 7.5 mg by mouth daily as needed for pain.   . mirabegron ER (MYRBETRIQ) 50 MG TB24 tablet Take 50 mg by mouth daily.  . montelukast (SINGULAIR) 10 MG tablet Take 10 mg by mouth at bedtime.  . ONE TOUCH ULTRA TEST test strip 1 each by Other route as needed for other.   .Marland Kitchen  psyllium (REGULOID) 0.52 G capsule Take 0.52 g by mouth daily.  Marland Kitchen. telmisartan-hydrochlorothiazide (MICARDIS HCT) 80-25 MG tablet Take 1 tablet by mouth daily.   . verapamil (CALAN) 120 MG tablet Take 120 mg by mouth 2 (two) times daily.   . vitamin E 600 UNIT capsule Take 600 Units by mouth daily.  . [DISCONTINUED] levothyroxine (SYNTHROID, LEVOTHROID) 112 MCG tablet Take 112 mcg by mouth daily.   No facility-administered encounter medications on file as of 10/24/2016.      Review of Systems  Constitutional:   No  weight loss, night sweats,  Fevers, chills,  +fatigue, or  lassitude.  HEENT:   No headaches,  Difficulty swallowing,  Tooth/dental  problems, or  Sore throat,                No sneezing, itching, ear ache, nasal congestion, post nasal drip,   CV:  No chest pain,  Orthopnea, PND, swelling in lower extremities, anasarca, dizziness, palpitations, syncope.   GI  No heartburn, indigestion, abdominal pain, nausea, vomiting, diarrhea, change in bowel habits, loss of appetite, bloody stools.   Resp:  No excess mucus, no productive cough,  No non-productive cough,  No coughing up of blood.  No change in color of mucus.  No wheezing.  No chest wall deformity  Skin: no rash or lesions.  GU: no dysuria, change in color of urine, no urgency or frequency.  No flank pain, no hematuria   MS:  No joint pain or swelling.  No decreased range of motion.  No back pain.    Physical Exam  BP 124/62 (BP Location: Left Arm, Patient Position: Sitting, Cuff Size: Normal)   Pulse 67   Ht 5' 1.75" (1.568 m)   Wt 178 lb 6.4 oz (80.9 kg)   SpO2 97%   BMI 32.89 kg/m   GEN: A/Ox3; pleasant , NAD, elderly    HEENT:  [place]/AT,  EACs-clear, TMs-wnl, NOSE-clear, THROAT-clear, no lesions, no postnasal drip or exudate noted. Class 2 MP airway   NECK:  Supple w/ fair ROM; no JVD; normal carotid impulses w/o bruits; no thyromegaly or nodules palpated; no lymphadenopathy.    RESP  Clear  P & A; w/o, wheezes/ rales/ or rhonchi. no accessory muscle use, no dullness to percussion  CARD:  RRR, no m/r/g, no peripheral edema, pulses intact, no cyanosis or clubbing.  GI:   Soft & nt; nml bowel sounds; no organomegaly or masses detected.   Musco: Warm bil, no deformities or joint swelling noted.   Neuro: alert, no focal deficits noted.    Skin: Warm, no lesions or rashes    Lab Results:  CBC No results found for: WBC, RBC, HGB, HCT, PLT, MCV, MCH, MCHC, RDW, LYMPHSABS, MONOABS, EOSABS, BASOSABS  BMET No results found for: NA, K, CL, CO2, GLUCOSE, BUN, CREATININE, CALCIUM, GFRNONAA, GFRAA  BNP No results found for: BNP  ProBNP No results  found for: PROBNP  Imaging: No results found.   Assessment & Plan:   No problem-specific Assessment & Plan notes found for this encounter.     Rubye Oaksammy Jayan Raymundo, NP 10/24/2016

## 2016-10-24 NOTE — Patient Instructions (Addendum)
Can discontinue daytime oxygen .  Check chest xray .  Continue on CPAP At bedtime   Check ONO on CPAP with oxygen .  CPAP download .  Follow up Dr. Vassie LollAlva  In 6 months and As needed

## 2016-10-24 NOTE — Assessment & Plan Note (Signed)
Appears compensated without overt failure  Cont on current regimen .

## 2016-10-24 NOTE — Assessment & Plan Note (Signed)
Cont on CPAP w/ O2  Check ONO on CPAP on room air to see if O2 is needed.

## 2016-10-24 NOTE — Assessment & Plan Note (Signed)
?  Intermittent . No COPD per spirometry  No flare of sx off Symbicort  Continue to monitor   Plan  Patient Instructions  Can discontinue daytime oxygen .  Check chest xray .  Continue on CPAP At bedtime   Check ONO on CPAP with oxygen .  CPAP download .  Follow up Dr. Vassie LollAlva  In 6 months and As needed

## 2016-10-28 ENCOUNTER — Telehealth: Payer: Self-pay | Admitting: Pulmonary Disease

## 2016-10-28 ENCOUNTER — Ambulatory Visit
Admission: RE | Admit: 2016-10-28 | Discharge: 2016-10-28 | Disposition: A | Discharge status: Home / Self Care | Payer: Medicare Other | Source: Ambulatory Visit | Attending: Internal Medicine | Admitting: Internal Medicine

## 2016-10-28 DIAGNOSIS — Z1231 Encounter for screening mammogram for malignant neoplasm of breast: Secondary | ICD-10-CM

## 2016-10-28 NOTE — Telephone Encounter (Signed)
Spoke with patient's daughter, Myrene BuddyYvonne, asking about the patient's O2 usage.  Sleep apnea - Parrett, Tammy S, NP at 10/24/2016 11:54 AM  Status: Written  Related Problem: Sleep apnea    Cont on CPAP w/ O2  Check ONO on CPAP on room air to see if O2 is needed.      Patient Instructions  Can discontinue daytime oxygen .  Check chest xray .  Continue on CPAP At bedtime   Check ONO on CPAP with oxygen .  CPAP download .  Follow up Dr. Vassie LollAlva  In 6 months and As needed   ------------------------  TP please clarify O2 use -- Patient is d/c daytime O2? Patient is to continue using CPAP WITH O2 until proven no longer needs O2? Patient is to have ONO on CPAP on Room Air?  The patient and daughter were confused on all of those instructions, there is some contradiction within the note.   TP- please advise, thanks.

## 2016-10-28 NOTE — Telephone Encounter (Signed)
Per TP- Pt's daytime O2 was d/c'ed at last OV, pt needs to stay on nocturnal O2 until the ONO on cpap and O2 is performed to see if she needs to continue wearing nocturnal O2 or not.  Thanks!

## 2016-10-28 NOTE — Telephone Encounter (Signed)
Spoke with pt's daughter, Myrene BuddyYvonne. She is aware of TP's response. Nothing further was needed.

## 2016-10-30 ENCOUNTER — Other Ambulatory Visit: Payer: Self-pay | Admitting: Internal Medicine

## 2016-10-30 DIAGNOSIS — R928 Other abnormal and inconclusive findings on diagnostic imaging of breast: Secondary | ICD-10-CM

## 2016-11-05 ENCOUNTER — Ambulatory Visit
Admission: RE | Admit: 2016-11-05 | Discharge: 2016-11-05 | Disposition: A | Discharge status: Home / Self Care | Payer: Medicare Other | Source: Ambulatory Visit | Attending: Internal Medicine | Admitting: Internal Medicine

## 2016-11-05 DIAGNOSIS — R928 Other abnormal and inconclusive findings on diagnostic imaging of breast: Secondary | ICD-10-CM

## 2016-12-17 ENCOUNTER — Encounter: Payer: Self-pay | Admitting: Adult Health

## 2016-12-26 ENCOUNTER — Telehealth: Payer: Self-pay | Admitting: Adult Health

## 2016-12-26 DIAGNOSIS — G4733 Obstructive sleep apnea (adult) (pediatric): Secondary | ICD-10-CM

## 2016-12-26 NOTE — Telephone Encounter (Signed)
Haley Sanders returned call to [place]. tr

## 2016-12-26 NOTE — Telephone Encounter (Signed)
Patent seen 8.3.18 w/ TP: Patient Instructions  Can discontinue daytime oxygen .  Check chest xray .  Continue on CPAP At bedtime   Check ONO on CPAP with oxygen .  CPAP download .  Follow up Dr. Vassie Loll  In 6 months and As needed        9.26.18 ONO results received and reviewed by TP: no significant desats.  May dc O2 at bedtime.  Continue CPAP at same settings.  LMOM TCB x1 to discuss results with patient ONO placed in scan folder

## 2016-12-26 NOTE — Telephone Encounter (Signed)
Spoke with pt, aware of results/resc.  Order placed to d/c nocturnal O2.  Nothing further needed.

## 2017-06-09 ENCOUNTER — Encounter: Payer: Self-pay | Admitting: Interventional Cardiology

## 2017-07-03 ENCOUNTER — Ambulatory Visit: Payer: Medicare Other | Admitting: Interventional Cardiology

## 2017-07-07 ENCOUNTER — Encounter: Payer: Self-pay | Admitting: Interventional Cardiology

## 2017-07-07 ENCOUNTER — Ambulatory Visit: Payer: Medicare Other | Admitting: Interventional Cardiology

## 2017-07-07 VITALS — BP 156/80 | HR 74 | Ht 61.0 in | Wt 172.1 lb

## 2017-07-07 DIAGNOSIS — R0602 Shortness of breath: Secondary | ICD-10-CM | POA: Diagnosis not present

## 2017-07-07 DIAGNOSIS — I119 Hypertensive heart disease without heart failure: Secondary | ICD-10-CM | POA: Diagnosis not present

## 2017-07-07 DIAGNOSIS — E119 Type 2 diabetes mellitus without complications: Secondary | ICD-10-CM

## 2017-07-07 DIAGNOSIS — I5189 Other ill-defined heart diseases: Secondary | ICD-10-CM | POA: Diagnosis not present

## 2017-07-07 DIAGNOSIS — I421 Obstructive hypertrophic cardiomyopathy: Secondary | ICD-10-CM

## 2017-07-07 NOTE — Progress Notes (Signed)
Cardiology Office Note   Date:  07/07/2017   ID:  Haley Sanders, DOB October 30, 1932, MRN [id number]  PCP:  Chilton Greathouse, MD    No chief complaint on file.  diastolic heart failure  Wt Readings from Last 3 Encounters:  07/07/17 172 lb 1.9 oz (78.1 kg)  10/24/16 178 lb 6.4 oz (80.9 kg)  10/06/16 176 lb 12.8 oz (80.2 kg)       History of Present Illness: Haley Sanders is a 84 y.o. female  Who has a h/o chronic diastolic heart failure.  She had normal LV function.    She was on oxygen for a while as well.  She was taken off of oxygen.   Denies : Chest pain. Dizziness. Nitroglycerin use. Orthopnea. Palpitations. Paroxysmal nocturnal dyspnea. Shortness of breath. Syncope.   Occasional leg edema. Better in the morning. Walking some, but could get more exercise.      Past Medical History:  Diagnosis Date  . Depression   . Diabetes mellitus   . GERD (gastroesophageal reflux disease)   . Hypercholesterolemia   . Hypertension   . Hypothyroidism   . Memory deficit   . Osteoporosis     Past Surgical History:  Procedure Laterality Date  . ABDOMINAL HYSTERECTOMY  2001  . FOOT SURGERY  1990   right foot  . ROTATOR CUFF REPAIR  1991   right  . ROTATOR CUFF REPAIR  2001   left side  . TONSILLECTOMY  1959  . TUBAL LIGATION  1975     Current Outpatient Medications  Medication Sig Dispense Refill  . albuterol (PROVENTIL HFA;VENTOLIN HFA) 108 (90 Base) MCG/ACT inhaler Inhale 2 puffs into the lungs every 6 (six) hours as needed for wheezing or shortness of breath.     Marland Kitchen aspirin 81 MG tablet Take 81 mg by mouth daily.     Marland Kitchen atorvastatin (LIPITOR) 40 MG tablet Take 40 mg by mouth daily.    . Azelastine HCl 0.15 % SOLN Place 2 sprays into the nose daily.     Marland Kitchen buPROPion (ZYBAN) 150 MG 12 hr tablet Take 150 mg by mouth 2 (two) times daily.    . cyanocobalamin 100 MCG tablet Take 100 mcg by mouth daily.    Marland Kitchen donepezil (ARICEPT) 10 MG tablet Take 10 mg by mouth at  bedtime as needed (MEMORY).     Marland Kitchen esomeprazole (NEXIUM) 40 MG capsule Take 40 mg by mouth daily before breakfast.    . fish oil-omega-3 fatty acids 1000 MG capsule Take 2 g by mouth daily.    Marland Kitchen FLUoxetine (PROZAC) 40 MG capsule Take 40 mg by mouth daily.    . meloxicam (MOBIC) 7.5 MG tablet Take 7.5 mg by mouth daily as needed for pain.     . metFORMIN (GLUCOPHAGE-XR) 500 MG 24 hr tablet Take 1 tablet by mouth daily.  3  . montelukast (SINGULAIR) 10 MG tablet Take 10 mg by mouth at bedtime.    . ONE TOUCH ULTRA TEST test strip 1 each by Other route as needed for other.     . psyllium (REGULOID) 0.52 G capsule Take 0.52 g by mouth daily.    Marland Kitchen telmisartan-hydrochlorothiazide (MICARDIS HCT) 80-25 MG tablet Take 1 tablet by mouth daily.     . verapamil (CALAN) 120 MG tablet Take 120 mg by mouth 2 (two) times daily.     . vitamin E 600 UNIT capsule Take 600 Units by mouth daily.     No current  facility-administered medications for this visit.     Allergies:   Codeine    Social History:  The patient  reports that she quit smoking about 29 years ago. She has a 33.00 pack-year smoking history. She has never used smokeless tobacco. She reports that she does not drink alcohol or use drugs.   Family History:  The patient's family history includes Heart attack in her father, maternal grandfather, and paternal grandmother.    ROS:  Please see the history of present illness.   Otherwise, review of systems are positive for edema, hip pain.   All other systems are reviewed and negative.    PHYSICAL EXAM: VS:  BP (!) 156/80   Pulse 74   Ht 5\' 1"  (1.549 m)   Wt 172 lb 1.9 oz (78.1 kg)   SpO2 94%   BMI 32.52 kg/m  , BMI Body mass index is 32.52 kg/m. GEN: Well nourished, well developed, in no acute distress  HEENT: normal  Neck: no JVD, carotid bruits, or masses Cardiac: RRR; no murmurs, rubs, or gallops,no edema  Respiratory:  clear to auscultation bilaterally, normal work of breathing GI:  soft, nontender, nondistended, + BS MS: no deformity or atrophy  Skin: warm and dry, no rash Neuro:  Strength and sensation are intact Psych: euthymic mood, full affect    Recent Labs: No results found for requested labs within last 8760 hours.   Lipid Panel No results found for: CHOL, TRIG, HDL, CHOLHDL, VLDL, LDLCALC, LDLDIRECT   Other studies Reviewed: Additional studies/ records that were reviewed today with results demonstrating: .   ASSESSMENT AND PLAN:  1. Chronic diastolic heart failure:  She appears euvolemic.  The current medical regimen is effective;  continue present plan and medications.  Recommended daily weights.  Minimize salt in the diet.  2. SHOB: Deconditioning in the past.  Increase exercise.  She is not getting 150 minutes /week.  SHOB has improved.  Hip pain limits her exercise.   3. HOCM: Cause of murmur.  38 mm Hg gradient.  No syncope or presyncope.  Avoid vasodilators if she neds more BP lowering meds.  Would prefer beta blocker such as carvedilol.  4. Hypertensive heart disease: BP controlled when she checks outside of the MDs office. She will see Dr. Felipa EthAvva in a few weeks.   5. DM: A1C 7.7 in 12/18.  Now on metformin.  Managed by Dr. Felipa EthAvva.   Current medicines are reviewed at length with the patient today.  The patient concerns regarding her medicines were addressed.  The following changes have been made:  No change  Labs/ tests ordered today include:  No orders of the defined types were placed in this encounter.   Recommend 150 minutes/week of aerobic exercise Low fat, low carb, high fiber diet recommended  Disposition:   FU in 1 year   Signed, Lance MussJayadeep Lucynda Rosano, MD  07/07/2017 2:33 PM    [facility] [address], [place], [place]  [id number] Phone: [phone]; Fax: [phone]) [phone]

## 2017-07-07 NOTE — Patient Instructions (Signed)

## 2017-07-23 ENCOUNTER — Telehealth: Payer: Self-pay

## 2017-07-23 NOTE — Telephone Encounter (Signed)
Notes sent to scheduling.   

## 2017-12-18 ENCOUNTER — Other Ambulatory Visit: Payer: Self-pay | Admitting: Internal Medicine

## 2017-12-18 DIAGNOSIS — Z1231 Encounter for screening mammogram for malignant neoplasm of breast: Secondary | ICD-10-CM

## 2017-12-31 ENCOUNTER — Telehealth: Payer: Self-pay | Admitting: Pulmonary Disease

## 2017-12-31 NOTE — Telephone Encounter (Signed)
Called and spoke with patient. She is needing a new CPAP but hasn't been seen in our office in almost a year.   Called spoke with patient she said [facility] ([facility]me) is going to lend her one for a month. She has been without a CPAP machine for 2 days.  Offered her an appointment with an APP and she is scheduled to see Angus Seller NP tomorrow 01/01/2018  Nothing further needed at this time.

## 2018-01-01 ENCOUNTER — Encounter: Payer: Self-pay | Admitting: Nurse Practitioner

## 2018-01-01 ENCOUNTER — Ambulatory Visit: Payer: Medicare Other | Admitting: Nurse Practitioner

## 2018-01-01 VITALS — BP 128/70 | HR 73 | Ht 61.75 in | Wt 166.0 lb

## 2018-01-01 DIAGNOSIS — G4733 Obstructive sleep apnea (adult) (pediatric): Secondary | ICD-10-CM

## 2018-01-01 NOTE — Progress Notes (Signed)
@Patient  ID: [name], female    DOB: 1932/10/31, 84 y.o.   MRN: [id number]  Chief Complaint  Patient presents with  . Follow-up    Needs new CPAP machine. Previous one no longer works, over 16 years old.    Referring provider: Chilton Greathouse, MD  HPI 84 year old female former smoker with OSA followed by Dr. Vassie Loll.  Tests: Spirometry 08/2007-ratio 83, FEV1 84%, FVC 79%  Spirometry 07/2016-poor effort, ratio 83, FEV1 115%, FVC 106%-normal 07/2016 walk test no desats.   OV 01/01/18  - needs new CPAP machine Patient presents today because she needs a new CPAP. Her current CPAP is more than 82 years old. She is compliant with her CPAP. She wears it daily. Her current machine has stopped working. [facility], but she needs an order for a new machine. She denies any current issues with mask or settings. She denies any shortness of breath, fever, or edema.     Allergies  Allergen Reactions  . Codeine Other (See Comments)    FEELS  EXTREMELY LOOPY    Immunization History  Administered Date(s) Administered  . Influenza, High Dose Seasonal PF 12/16/2017  . Influenza-Unspecified 12/23/2015  . Pneumococcal-Unspecified 10/25/2014    Past Medical History:  Diagnosis Date  . Depression   . Diabetes mellitus   . GERD (gastroesophageal reflux disease)   . Hypercholesterolemia   . Hypertension   . Hypothyroidism   . Memory deficit   . Osteoporosis     Tobacco History: Social History   Tobacco Use  Smoking Status Former Smoker  . Packs/day: 1.00  . Years: 33.00  . Pack years: 33.00  . Last attempt to quit: 04/22/1988  . Years since quitting: 29.7  Smokeless Tobacco Never Used   Counseling given: Yes   Outpatient Encounter Medications as of 01/01/2018  Medication Sig  . albuterol (PROVENTIL HFA;VENTOLIN HFA) 108 (90 Base) MCG/ACT inhaler Inhale 2 puffs into the lungs every 6 (six) hours as needed for wheezing or shortness of breath.     Marland Kitchen aspirin 81 MG tablet Take 81 mg by mouth daily.   Marland Kitchen atorvastatin (LIPITOR) 40 MG tablet Take 40 mg by mouth daily.  . Azelastine HCl 0.15 % SOLN Place 2 sprays into the nose daily.   Marland Kitchen buPROPion (ZYBAN) 150 MG 12 hr tablet Take 150 mg by mouth 2 (two) times daily.  . cyanocobalamin 100 MCG tablet Take 100 mcg by mouth daily.  Marland Kitchen donepezil (ARICEPT) 10 MG tablet Take 10 mg by mouth at bedtime as needed (MEMORY).   Marland Kitchen esomeprazole (NEXIUM) 40 MG capsule Take 40 mg by mouth daily before breakfast.  . fish oil-omega-3 fatty acids 1000 MG capsule Take 2 g by mouth daily.  Marland Kitchen FLUoxetine (PROZAC) 40 MG capsule Take 40 mg by mouth daily.  Marland Kitchen levocetirizine (XYZAL) 5 MG tablet Take 1 tablet by mouth daily.  Marland Kitchen levothyroxine (SYNTHROID, LEVOTHROID) 100 MCG tablet Take 1 tablet by mouth daily.  . meloxicam (MOBIC) 7.5 MG tablet Take 7.5 mg by mouth daily as needed for pain.   . metFORMIN (GLUCOPHAGE-XR) 500 MG 24 hr tablet Take 1 tablet by mouth daily.  . montelukast (SINGULAIR) 10 MG tablet Take 10 mg by mouth at bedtime.  . ONE TOUCH ULTRA TEST test strip 1 each by Other route as needed for other.   . psyllium (REGULOID) 0.52 G capsule Take 0.52 g by mouth daily.  . solifenacin (VESICARE) 5 MG tablet  Take 1 tablet by mouth daily.  Marland Kitchen telmisartan-hydrochlorothiazide (MICARDIS HCT) 80-25 MG tablet Take 1 tablet by mouth daily.   . verapamil (CALAN) 120 MG tablet Take 120 mg by mouth 2 (two) times daily.   . vitamin E 600 UNIT capsule Take 600 Units by mouth daily.   No facility-administered encounter medications on file as of 01/01/2018.      Review of Systems  Review of Systems  Constitutional: Negative.   HENT: Negative.   Respiratory: Negative for cough, shortness of breath and wheezing.   Cardiovascular: Negative.   Gastrointestinal: Negative.   Allergic/Immunologic: Negative.   Neurological: Negative.   Psychiatric/Behavioral: Negative.        Physical Exam  BP 128/70 (BP Location:  Left Arm, Patient Position: Sitting, Cuff Size: Normal)   Pulse 73   Ht 5' 1.75" (1.568 m)   Wt 166 lb (75.3 kg)   SpO2 95%   BMI 30.61 kg/m   Wt Readings from Last 5 Encounters:  01/01/18 166 lb (75.3 kg)  07/07/17 172 lb 1.9 oz (78.1 kg)  10/24/16 178 lb 6.4 oz (80.9 kg)  10/06/16 176 lb 12.8 oz (80.2 kg)  07/23/16 174 lb 3.2 oz (79 kg)     Physical Exam  Constitutional: She is oriented to person, place, and time. She appears well-developed and well-nourished. No distress.  Cardiovascular: Normal rate and regular rhythm.  Pulmonary/Chest: Effort normal and breath sounds normal.  Neurological: She is alert and oriented to person, place, and time.  Psychiatric: She has a normal mood and affect.  Nursing note and vitals reviewed.     Assessment & Plan:   Sleep apnea Patient Instructions  Patient continues to benefit from CPAP with good compliance  Will order new CPAP at [facility] Continue CPAP nightly Goal of 4 hours usage per night Do not drive if drowsy Will try to get compliance report  Discussion:  Obesity. - discussed how weight can impact sleep and risk for sleep disordered breathing - discussed options to assist with weight loss: combination of diet modification, cardiovascular and strength training exercises   Cardiovascular risk. - had an extensive discussion regarding the adverse health consequences related to untreated sleep disordered breathing - specifically discussed the risks for hypertension, coronary artery disease, cardiac dysrhythmias, cerebrovascular disease, and diabetes - lifestyle modification discussed   Safe driving practices. - discussed how sleep disruption can increase risk of accidents, particularly when driving - safe driving practices were discussed   Therapies for obstructive sleep apnea. - if the sleep study shows significant sleep apnea, then various therapies for treatment were reviewed: CPAP, oral  appliance, and surgical interventions        Ivonne Andrew, NP 01/01/2018

## 2018-01-01 NOTE — Patient Instructions (Addendum)
Patient continues to benefit from CPAP with good compliance  Will order new CPAP at current [facility] Continue CPAP nightly Goal of 4 hours usage per night Do not drive if drowsy Will try to get compliance report

## 2018-01-01 NOTE — Assessment & Plan Note (Signed)
Patient Instructions  Patient continues to benefit from CPAP with good compliance  Will order new CPAP at current [facility] Continue CPAP nightly Goal of 4 hours usage per night Do not drive if drowsy Will try to get compliance report

## 2018-01-04 ENCOUNTER — Telehealth: Payer: Self-pay | Admitting: Pulmonary Disease

## 2018-01-04 DIAGNOSIS — G4733 Obstructive sleep apnea (adult) (pediatric): Secondary | ICD-10-CM

## 2018-01-04 NOTE — Telephone Encounter (Signed)
Attempted to call Thereasa Distance with [facility] but he was currently out of the office.  Left a message for Thereasa Distance to return our call x1

## 2018-01-05 NOTE — Telephone Encounter (Signed)
Thereasa Distance is calling back [phone]  [phone]

## 2018-01-05 NOTE — Telephone Encounter (Signed)
Called Thereasa Distance from [facility] to check to see what pt's pressure settings should be so I can place it in the Rx for pt.  Per Thereasa Distance, pressure settings from 2011 which look to still be pt's current settings are 11cm. Order has been placed on the Rx template with this information. Nothing further needed.

## 2018-01-05 NOTE — Telephone Encounter (Signed)
Attempted to call Rodney with [facility]. I did not receive an answer. I have left a message for Rodney to return our call.  

## 2018-01-05 NOTE — Telephone Encounter (Signed)
Thereasa Distance [facility] [phone] ext [phone]) returned phone call; pt will need pressure settings on the Rx

## 2018-01-18 ENCOUNTER — Ambulatory Visit: Payer: Medicare Other

## 2018-03-02 ENCOUNTER — Ambulatory Visit
Admission: RE | Admit: 2018-03-02 | Discharge: 2018-03-02 | Disposition: A | Discharge status: Home / Self Care | Payer: Medicare Other | Source: Ambulatory Visit | Attending: Internal Medicine | Admitting: Internal Medicine

## 2018-03-02 DIAGNOSIS — Z1231 Encounter for screening mammogram for malignant neoplasm of breast: Secondary | ICD-10-CM

## 2018-03-29 ENCOUNTER — Encounter: Payer: Self-pay | Admitting: Acute Care

## 2018-03-29 ENCOUNTER — Ambulatory Visit (INDEPENDENT_AMBULATORY_CARE_PROVIDER_SITE_OTHER): Payer: Medicare Other | Admitting: Acute Care

## 2018-03-29 DIAGNOSIS — G4733 Obstructive sleep apnea (adult) (pediatric): Secondary | ICD-10-CM | POA: Diagnosis not present

## 2018-03-29 MED ORDER — BIOTENE PBF DRY MOUTH MT LIQD: OROMUCOSAL | 0 refills | Status: AC

## 2018-03-29 NOTE — Assessment & Plan Note (Signed)
Compliant with CPAP after getting new machine AHI of 2/2 Plan Continue on CPAP at bedtime. You appear to be benefiting from the treatment Goal is to wear for at least 6 hours each night for maximal clinical benefit. Continue to work on weight loss, as the link between excess weight  and sleep apnea is well established.  Do not drive if sleepy. Remember to clean mask, tubing, filter, and reservoir once weekly with soapy water.  Follow up with Dr. Vassie Loll in 3 months or as needed We will prescribe Biotene mouthwash for dry mouth. Please use one tablespoon and rinse for 30 seconds, then spit out. You may use this up to 5 times daily depending on your needs. Please contact office for sooner follow up if symptoms do not improve or worsen or seek emergency care .

## 2018-03-29 NOTE — Patient Instructions (Addendum)
It is nice to see you today Continue on CPAP at bedtime. You appear to be benefiting from the treatment Goal is to wear for at least 6 hours each night for maximal clinical benefit. Continue to work on weight loss, as the link between excess weight  and sleep apnea is well established.  Do not drive if sleepy. Remember to clean mask, tubing, filter, and reservoir once weekly with soapy water.  Follow up with Dr. Vassie LollAlva in 3 months or as needed We will prescribe Biotene mouthwash for dry mouth. Please use one tablespoon and rinse for 30 seconds, then spit out. You may use this up to 5 times daily depending on your needs. Please contact office for sooner follow up if symptoms do not improve or worsen or seek emergency care .

## 2018-03-29 NOTE — Progress Notes (Signed)
History of Present Illness Haley Sanders is a 85 y.o. female former smoker ( Quit [phone] with a 31 pack year smoking history) with OSA, on CPAP therapy. She is followed by Dr. Vassie LollAlva.   03/29/2018 Follow up Appointment: Pt. Presents for follow up. She was last seen 01/01/2018 with primary need for new CPAP machine.Her machine was 83 years old and did not work well. She was compliant with use at the time. She presents today with a down load from her new CPAP machine. She states she likes her new machine. She is compliant daily. She has set pressure of 11 cm H2O. She feels her mask fits better, but she states she has issues with dry mouth. She is using humidity at the highest setting of 8. She denies fever, chest pain, orthopnea or hemoptysis    Down Load: AirSense 10 AutoSet Set pressure of 11 cm H2O AHI= 2.2 No central apneas Average Use 7 hours, 22 minutes Use 30 of 30 days or 100% Median Leaks of 12.7 L/min  Test Results: 2005 Polysomnogram AHI of 19 Moderate oxygen desaturations Mild to moderate OSA  ONO 12/17/2016 No significant desaturations Nocturnal oxygen was discontinued  Spirometry 08/2007-ratio 83, FEV1 84%, FVC 79%  Spirometry 07/2016-poor effort, ratio 83, FEV1 115%, FVC 106%-normal 07/2016 walk test no desats.  BNP No results found for: BNP  ProBNP No results found for: PROBNP  PFT No results found for: FEV1PRE, FEV1POST, FVCPRE, FVCPOST, TLC, DLCOUNC, PREFEV1FVCRT, PSTFEV1FVCRT  Mm 3d Screen Breast Bilateral  Result Date: 03/02/2018 CLINICAL DATA:  Screening. EXAM: DIGITAL SCREENING BILATERAL MAMMOGRAM WITH TOMO AND CAD COMPARISON:  Previous exam(s). ACR Breast Density Category b: There are scattered areas of fibroglandular density. FINDINGS: There are no findings suspicious for malignancy. Images were processed with CAD. IMPRESSION: No mammographic evidence of malignancy. A result letter of this screening mammogram will be mailed directly to the  patient. RECOMMENDATION: Screening mammogram in one year. (Code:SM-B-01Y) BI-RADS CATEGORY  1: Negative. Electronically Signed   By: Edwin CapJennifer  Jarosz M.D.   On: 03/02/2018 16:47     Past medical hx Past Medical History:  Diagnosis Date  . Depression   . Diabetes mellitus   . GERD (gastroesophageal reflux disease)   . Hypercholesterolemia   . Hypertension   . Hypothyroidism   . Memory deficit   . Osteoporosis      Social History   Tobacco Use  . Smoking status: Former Smoker    Packs/day: 1.00    Years: 33.00    Pack years: 33.00    Last attempt to quit: 04/22/1988    Years since quitting: 29.9  . Smokeless tobacco: Never Used  Substance Use Topics  . Alcohol use: No  . Drug use: No    [name] reports that she quit smoking about 29 years ago. She has a 33.00 pack-year smoking history. She has never used smokeless tobacco. She reports that she does not drink alcohol or use drugs.  Tobacco Cessation: Former smoker quit 1990 with a 31 pack year smoking history.  Past surgical hx, Family hx, Social hx all reviewed.  Current Outpatient Medications on File Prior to Visit  Medication Sig  . albuterol (PROVENTIL HFA;VENTOLIN HFA) 108 (90 Base) MCG/ACT inhaler Inhale 2 puffs into the lungs every 6 (six) hours as needed for wheezing or shortness of breath.   Marland Kitchen. aspirin 81 MG tablet Take 81 mg by mouth daily.   Marland Kitchen. atorvastatin (LIPITOR) 40 MG tablet Take 40 mg by mouth daily.  .Marland Kitchen  Azelastine HCl 0.15 % SOLN Place 2 sprays into the nose daily.   Marland Kitchen buPROPion (ZYBAN) 150 MG 12 hr tablet Take 150 mg by mouth 2 (two) times daily.  . cyanocobalamin 100 MCG tablet Take 100 mcg by mouth daily.  Marland Kitchen donepezil (ARICEPT) 10 MG tablet Take 10 mg by mouth at bedtime as needed (MEMORY).   Marland Kitchen esomeprazole (NEXIUM) 40 MG capsule Take 40 mg by mouth daily before breakfast.  . fish oil-omega-3 fatty acids 1000 MG capsule Take 2 g by mouth daily.  Marland Kitchen FLUoxetine (PROZAC) 40 MG capsule Take 40 mg by  mouth daily.  Marland Kitchen levocetirizine (XYZAL) 5 MG tablet Take 1 tablet by mouth daily.  Marland Kitchen levothyroxine (SYNTHROID, LEVOTHROID) 100 MCG tablet Take 1 tablet by mouth daily.  . meloxicam (MOBIC) 7.5 MG tablet Take 7.5 mg by mouth daily as needed for pain.   . metFORMIN (GLUCOPHAGE-XR) 500 MG 24 hr tablet Take 1 tablet by mouth daily.  . montelukast (SINGULAIR) 10 MG tablet Take 10 mg by mouth at bedtime.  . ONE TOUCH ULTRA TEST test strip 1 each by Other route as needed for other.   . psyllium (REGULOID) 0.52 G capsule Take 0.52 g by mouth daily.  . solifenacin (VESICARE) 5 MG tablet Take 1 tablet by mouth daily.  Marland Kitchen telmisartan-hydrochlorothiazide (MICARDIS HCT) 80-25 MG tablet Take 1 tablet by mouth daily.   . verapamil (CALAN) 120 MG tablet Take 120 mg by mouth 2 (two) times daily.   . vitamin E 600 UNIT capsule Take 600 Units by mouth daily.   No current facility-administered medications on file prior to visit.      Allergies  Allergen Reactions  . Codeine Other (See Comments)    FEELS  EXTREMELY LOOPY    Review Of Systems:  Constitutional:   No  weight loss, night sweats,  Fevers, chills, fatigue, or  lassitude.  HEENT:   No headaches,  Difficulty swallowing,  Tooth/dental problems, or  Sore throat,                No sneezing, itching, ear ache, nasal congestion, post nasal drip, + dry mouth  CV:  No chest pain,  Orthopnea, PND, swelling in lower extremities, anasarca, dizziness, palpitations, syncope.   GI  No heartburn, indigestion, abdominal pain, nausea, vomiting, diarrhea, change in bowel habits, loss of appetite, bloody stools.   Resp: No shortness of breath with exertion or at rest.  No excess mucus, no productive cough,  No non-productive cough,  No coughing up of blood.  No change in color of mucus.  No wheezing.  No chest wall deformity  Skin: no rash or lesions.  GU: no dysuria, change in color of urine, no urgency or frequency.  No flank pain, no hematuria   MS:  No  joint pain or swelling.  No decreased range of motion.  No back pain.  Psych:  No change in mood or affect. No depression or anxiety.  No memory loss.   Vital Signs BP (!) 148/70 (BP Location: Right Arm, Cuff Size: Normal)   Pulse 70   Ht 5' 1.5" (1.562 m)   Wt 164 lb 6.4 oz (74.6 kg)   SpO2 98%   BMI 30.56 kg/m    Physical Exam:  General- No distress,  A&Ox3, pleasant ENT: No sinus tenderness, TM clear, pale nasal mucosa, no oral exudate,no post nasal drip, no LAN, MM dry Cardiac: S1, S2, regular rate and rhythm, no murmur Chest: No wheeze/ rales/ dullness;  no accessory muscle use, no nasal flaring, no sternal retractions, slightly diminished per bases Abd.: Soft Non-tender, ND, BS +, Body mass index is 30.56 kg/m. Ext: No clubbing cyanosis, edema Neuro:  normal strength, MAE x 4, A&O x 3 Skin: No rashes, warm and dry Psych: normal mood and behavior   Assessment/Plan  Sleep apnea Compliant with CPAP after getting new machine AHI of 2/2 Plan Continue on CPAP at bedtime. You appear to be benefiting from the treatment Goal is to wear for at least 6 hours each night for maximal clinical benefit. Continue to work on weight loss, as the link between excess weight  and sleep apnea is well established.  Do not drive if sleepy. Remember to clean mask, tubing, filter, and reservoir once weekly with soapy water.  Follow up with Dr. Vassie Loll in 3 months or as needed We will prescribe Biotene mouthwash for dry mouth. Please use one tablespoon and rinse for 30 seconds, then spit out. You may use this up to 5 times daily depending on your needs. Please contact office for sooner follow up if symptoms do not improve or worsen or seek emergency care .     Bevelyn Ngo, NP 03/29/2018  12:37 PM

## 2018-04-09 ENCOUNTER — Emergency Department (HOSPITAL_COMMUNITY)
Admission: EM | Admit: 2018-04-09 | Discharge: 2018-04-10 | Disposition: A | Discharge status: Home / Self Care | Payer: Medicare Other | Attending: Emergency Medicine | Admitting: Emergency Medicine

## 2018-04-09 ENCOUNTER — Encounter (HOSPITAL_COMMUNITY): Payer: Self-pay | Admitting: Emergency Medicine

## 2018-04-09 DIAGNOSIS — Z79899 Other long term (current) drug therapy: Secondary | ICD-10-CM | POA: Insufficient documentation

## 2018-04-09 DIAGNOSIS — E119 Type 2 diabetes mellitus without complications: Secondary | ICD-10-CM | POA: Diagnosis not present

## 2018-04-09 DIAGNOSIS — Z87891 Personal history of nicotine dependence: Secondary | ICD-10-CM | POA: Diagnosis not present

## 2018-04-09 DIAGNOSIS — Z7984 Long term (current) use of oral hypoglycemic drugs: Secondary | ICD-10-CM | POA: Diagnosis not present

## 2018-04-09 DIAGNOSIS — E039 Hypothyroidism, unspecified: Secondary | ICD-10-CM | POA: Insufficient documentation

## 2018-04-09 DIAGNOSIS — T783XXA Angioneurotic edema, initial encounter: Secondary | ICD-10-CM | POA: Diagnosis not present

## 2018-04-09 DIAGNOSIS — R600 Localized edema: Secondary | ICD-10-CM | POA: Insufficient documentation

## 2018-04-09 DIAGNOSIS — I158 Other secondary hypertension: Secondary | ICD-10-CM | POA: Diagnosis not present

## 2018-04-09 DIAGNOSIS — I10 Essential (primary) hypertension: Secondary | ICD-10-CM | POA: Diagnosis present

## 2018-04-09 LAB — COMPREHENSIVE METABOLIC PANEL
ALBUMIN: 4.2 g/dL (ref 3.5–5.0)
ALT: 20 U/L (ref 0–44)
ANION GAP: 11 (ref 5–15)
AST: 22 U/L (ref 15–41)
Alkaline Phosphatase: 44 U/L (ref 38–126)
BUN: 14 mg/dL (ref 8–23)
CHLORIDE: 104 mmol/L (ref 98–111)
CO2: 25 mmol/L (ref 22–32)
Calcium: 9.4 mg/dL (ref 8.9–10.3)
Creatinine, Ser: 0.91 mg/dL (ref 0.44–1.00)
GFR calc Af Amer: >60 mL/min (ref >60)
GFR calc non Af Amer: 58 mL/min — ABNORMAL LOW (ref >60)
GLUCOSE: 97 mg/dL (ref 70–99)
POTASSIUM: 4.2 mmol/L (ref 3.5–5.1)
SODIUM: 140 mmol/L (ref 135–145)
Total Bilirubin: 0.6 mg/dL (ref 0.3–1.2)
Total Protein: 7.4 g/dL (ref 6.5–8.1)

## 2018-04-09 LAB — CBC WITH DIFFERENTIAL/PLATELET
ABS IMMATURE GRANULOCYTES: 0.03 10*3/uL (ref 0.00–0.07)
Basophils Absolute: 0 10*3/uL (ref 0.0–0.1)
Basophils Relative: 0 %
Eosinophils Absolute: 0.2 10*3/uL (ref 0.0–0.5)
Eosinophils Relative: 2 %
HCT: 39.3 % (ref 36.0–46.0)
HEMOGLOBIN: 12.3 g/dL (ref 12.0–15.0)
IMMATURE GRANULOCYTES: 0 %
LYMPHS ABS: 3.7 10*3/uL (ref 0.7–4.0)
LYMPHS PCT: 47 %
MCH: 28.7 pg (ref 26.0–34.0)
MCHC: 31.3 g/dL (ref 30.0–36.0)
MCV: 91.6 fL (ref 80.0–100.0)
MONO ABS: 0.7 10*3/uL (ref 0.1–1.0)
MONOS PCT: 10 %
NEUTROS PCT: 41 %
Neutro Abs: 3.2 10*3/uL (ref 1.7–7.7)
Platelets: 207 10*3/uL (ref 150–400)
RBC: 4.29 MIL/uL (ref 3.87–5.11)
RDW: 14 % (ref 11.5–15.5)
WBC: 7.8 10*3/uL (ref 4.0–10.5)
nRBC: 0 % (ref 0.0–0.2)

## 2018-04-09 NOTE — ED Triage Notes (Signed)
Pt reports her blood pressure is elevated. Was taken at PCP earlier and then again at the drug store.  No neruo symptoms.

## 2018-04-10 MED ORDER — CLONIDINE HCL 0.1 MG PO TABS: 0.1000 mg | ORAL_TABLET | Freq: Two times a day (BID) | ORAL | 0 refills | Status: DC

## 2018-04-10 MED ORDER — HYDROCHLOROTHIAZIDE 25 MG PO TABS: 25.0000 mg | ORAL_TABLET | Freq: Every day | ORAL | 0 refills | Status: AC

## 2018-04-10 NOTE — ED Notes (Signed)
Patient has mild swelling to left side of face

## 2018-04-10 NOTE — ED Provider Notes (Signed)
[facility] Provider [facility]  CSN: [id number] Arrival date & time: 04/09/18 2042  Chief Complaint(s) Hypertension  HPI Haley Sanders is a 85 y.o. female with a history of hypertension who presents to the emergency department with elevated blood pressures.  Patient had elevated blood pressures with systolics in the 170s had the primary care provider during a clinic visit yesterday.  No changes were made to her medication she was told to keep track of her blood pressures.  After getting home she had blood pressures with systolics in the 200s multiple times.  She confirmed by going to the pharmacy and repeating the blood pressure there.  Patient denied any associated headache, visual disturbance, focal deficits, chest pain, shortness of breath, abdominal pain, dizziness.  Patient took her nighttime medication.  On the way to the emergency department she reports noticing some tingling to the left side of her face.   HPI  Past Medical History Past Medical History:  Diagnosis Date  . Depression   . Diabetes mellitus   . GERD (gastroesophageal reflux disease)   . Hypercholesterolemia   . Hypertension   . Hypothyroidism   . Memory deficit   . Osteoporosis    Patient Active Problem List   Diagnosis Date Noted  . Diastolic dysfunction without heart failure 12/19/2015  . Essential hypertension 12/19/2015  . Asthma 09/01/2007  . Sleep apnea 09/01/2007   Home Medication(s) Prior to Admission medications   Medication Sig Start Date End Date Taking? Authorizing Provider  albuterol (PROVENTIL HFA;VENTOLIN HFA) 108 (90 Base) MCG/ACT inhaler Inhale 2 puffs into the lungs every 6 (six) hours as needed for wheezing or shortness of breath.    Yes [provider]  aspirin 81 MG tablet Take 81 mg by mouth daily.    Yes [provider]  atorvastatin (LIPITOR) 40 MG tablet Take 40 mg by mouth daily.   Yes [provider]  buPROPion  (ZYBAN) 150 MG 12 hr tablet Take 150 mg by mouth 2 (two) times daily.   Yes [provider]  cyanocobalamin 100 MCG tablet Take 100 mcg by mouth daily.   Yes [provider]  donepezil (ARICEPT) 10 MG tablet Take 10 mg by mouth at bedtime.    Yes [provider]  esomeprazole (NEXIUM) 40 MG capsule Take 40 mg by mouth at bedtime.    Yes [provider]  fish oil-omega-3 fatty acids 1000 MG capsule Take 1 g by mouth daily.    Yes [provider]  FLUoxetine (PROZAC) 40 MG capsule Take 40 mg by mouth daily.   Yes [provider]  levocetirizine (XYZAL) 5 MG tablet Take 5 mg by mouth every evening.  12/07/17  Yes [provider]  levothyroxine (SYNTHROID, LEVOTHROID) 100 MCG tablet Take 100 mcg by mouth daily before breakfast.  11/19/17  Yes [provider]  metFORMIN (GLUCOPHAGE-XR) 500 MG 24 hr tablet Take 500 mg by mouth daily with breakfast.  07/02/17  Yes [provider]  montelukast (SINGULAIR) 10 MG tablet Take 10 mg by mouth at bedtime.   Yes [provider]  Mouthwashes (BIOTENE/CALCIUM PBF) LIQD Take one tablespoon by mouth (15ml) and rinse solution for 30 seconds then spit out Patient taking differently: 15 mLs by Mouth Rinse route See admin instructions. Rinse daily for 30 seconds, using 15 ml's, then spit out- as directed 03/29/18  Yes Bevelyn NgoGroce, Sarah F, NP  psyllium (REGULOID) 0.52 G capsule Take 0.52 g by mouth 2 (two)  times daily.    Yes [provider]  solifenacin (VESICARE) 5 MG tablet Take 5 mg by mouth at bedtime.  12/30/17  Yes [provider]  verapamil (CALAN) 120 MG tablet Take 60 mg by mouth 2 (two) times daily.    Yes [provider]  cloNIDine (CATAPRES) 0.1 MG tablet Take 1 tablet (0.1 mg total) by mouth 2 (two) times daily for 14 days. 04/10/18 04/24/18  Nira Conn, MD  hydrochlorothiazide (HYDRODIURIL) 25 MG tablet Take 1 tablet (25 mg total) by mouth daily  for 30 days. 04/10/18 05/10/18  Nira Conn, MD  ONE TOUCH ULTRA TEST test strip 1 each by Other route as needed for other.  11/16/15   [provider]  vitamin E 600 UNIT capsule Take 600 Units by mouth daily.    [provider]                                                                                                                                    Past Surgical History Past Surgical History:  Procedure Laterality Date  . ABDOMINAL HYSTERECTOMY  2001  . FOOT SURGERY  1990   right foot  . ROTATOR CUFF REPAIR  1991   right  . ROTATOR CUFF REPAIR  2001   left side  . TONSILLECTOMY  1959  . TUBAL LIGATION  1975   Family History Family History  Problem Relation Age of Onset  . Heart attack Father   . Heart attack Maternal Grandfather   . Heart attack Paternal Grandmother   . Breast cancer Neg Hx     Social History Social History   Tobacco Use  . Smoking status: Former Smoker    Packs/day: 1.00    Years: 33.00    Pack years: 33.00    Last attempt to quit: 04/22/1988    Years since quitting: 29.9  . Smokeless tobacco: Never Used  Substance Use Topics  . Alcohol use: No  . Drug use: No   Allergies Codeine; Grass extracts [gramineae pollens]; and Mold extract [trichophyton]  Review of Systems Review of Systems All other systems are reviewed and are negative for acute change except as noted in the HPI  Physical Exam Vital Signs  I have reviewed the triage vital signs BP (!) 181/78 (BP Location: Left Arm)   Pulse 61   Temp 98 F (36.7 C) (Oral)   Resp 18   SpO2 98%   Physical Exam Vitals signs reviewed.  Constitutional:      General: She is not in acute distress.    Appearance: She is well-developed. She is not diaphoretic.  HENT:     Head: Normocephalic and atraumatic.      Nose: Nose normal.  Eyes:     General: No scleral icterus.       Right eye: No discharge.        Left eye: No discharge.  Conjunctiva/sclera:  Conjunctivae normal.     Pupils: Pupils are equal, round, and reactive to light.  Neck:     Musculoskeletal: Normal range of motion and neck supple.  Cardiovascular:     Rate and Rhythm: Normal rate and regular rhythm.     Heart sounds: No murmur. No friction rub. No gallop.   Pulmonary:     Effort: Pulmonary effort is normal. No respiratory distress.     Breath sounds: Normal breath sounds. No stridor. No rales.  Abdominal:     General: There is no distension.     Palpations: Abdomen is soft.     Tenderness: There is no abdominal tenderness.  Musculoskeletal:        General: No tenderness.  Skin:    General: Skin is warm and dry.     Findings: No erythema or rash.  Neurological:     Mental Status: She is alert and oriented to person, place, and time.     Comments: Mental Status:  Alert and oriented to person, place, and time.  Attention and concentration normal.  Speech clear.  Recent memory is intact  Cranial Nerves:  II Visual Fields: Intact to confrontation. Visual fields intact. III, IV, VI: Pupils equal and reactive to light and near. Full eye movement without nystagmus  V Facial Sensation: Normal. No weakness of masticatory muscles  VII: No facial weakness or asymmetry  VIII Auditory Acuity: Grossly normal  IX/X: The uvula is midline; the palate elevates symmetrically  XI: Normal sternocleidomastoid and trapezius strength  XII: The tongue is midline. No atrophy or fasciculations.   Motor System: Muscle Strength: 5/5 and symmetric in the upper and lower extremities. No pronation or drift.  Muscle Tone: Tone and muscle bulk are normal in the upper and lower extremities.   Reflexes: DTRs: 1+ and symmetrical in all four extremities. No Clonus Coordination: Intact finger-to-nose. No tremor.  Sensation: Intact to light touch, and pinprick. Negative Romberg test.  Gait: Routine gait normal.      ED Results and Treatments Labs (all labs ordered are listed, but only  abnormal results are displayed) Labs Reviewed  COMPREHENSIVE METABOLIC PANEL - Abnormal; Notable for the following components:      Result Value   GFR calc non Af Amer 58 (*)    All other components within normal limits  CBC WITH DIFFERENTIAL/PLATELET                                                                                                                         EKG  EKG Interpretation  Date/Time:  Friday April 09 2018 20:56:28 EST Ventricular Rate:  62 PR Interval:  222 QRS Duration: 86 QT Interval:  426 QTC Calculation: 432 R Axis:   0 Text Interpretation:  Sinus rhythm with 1st degree A-V block Abnormal ECG Otherwise no significant change Reconfirmed by Drema Pryardama,  ([id number]) on 04/10/2018 3:01:13 AM      Radiology No results found. Pertinent labs & imaging results  that were available during my care of the patient were reviewed by me and considered in my medical decision making (see chart for details).  Medications Ordered in ED Medications - No data to display                                                                                                                                  Procedures Procedures  (including critical care time)  Medical Decision Making / ED Course I have reviewed the nursing notes for this encounter and the patient's prior records (if available in EHR or on provided paperwork).    Patient presents with hypertension with systolics in the 200s.  Has improved after taking her home medication.  Patient has no focal deficits on exam.  Does not have any cardiac complaints.  Screening labs without renal insufficiency.  EKG without acute ischemic changes.  Patient does appear to have mild angioedema of the left face.  May be related to ARB.  Patient was monitored for several hours without worsening of symptoms.  No respiratory distress.  Medication will need to be adjusted.  Final Clinical Impression(s) / ED Diagnoses Final diagnoses:    Other secondary hypertension  Angioedema, initial encounter    Disposition: Discharge  Condition: Good  I have discussed the results, Dx and Tx plan with the patient and daughter who expressed understanding and agree(s) with the plan. Discharge instructions discussed at great length. The patient and daughter was given strict return precautions who verbalized understanding of the instructions. No further questions at time of discharge.    ED Discharge Orders         Ordered    hydrochlorothiazide (HYDRODIURIL) 25 MG tablet  Daily     04/10/18 0310    cloNIDine (CATAPRES) 0.1 MG tablet  2 times daily     04/10/18 0310           Follow Up: Chilton Greathouse, MD [address] [place] [address] [phone]  Call in 3 days For close follow up regarding blood pressure management     This chart was dictated using voice recognition software.  Despite best efforts to proofread,  errors can occur which can change the documentation meaning.   Nira Conn, MD 04/10/18 [phone]

## 2018-06-28 ENCOUNTER — Ambulatory Visit: Payer: Medicare Other | Admitting: Pulmonary Disease

## 2018-07-05 ENCOUNTER — Telehealth: Payer: Self-pay

## 2018-07-05 NOTE — Telephone Encounter (Signed)
Virtual Visit Pre-Appointment Phone Call  Steps For Call:  1. Confirm consent - "In the setting of the current Covid19 crisis, you are scheduled for a (phone or video) visit with your provider on (date) at (time).  Just as we do with many in-office visits, in order for you to participate in this visit, we must obtain consent.  If you'd like, I can send this to your mychart (if signed up) or email for you to review.  Otherwise, I can obtain your verbal consent now.  All virtual visits are billed to your insurance company just like a normal visit would be.  By agreeing to a virtual visit, we'd like you to understand that the technology does not allow for your provider to perform an examination, and thus may limit your provider's ability to fully assess your condition.  Finally, though the technology is pretty good, we cannot assure that it will always work on either your or our end, and in the setting of a video visit, we may have to convert it to a phone-only visit.  In either situation, we cannot ensure that we have a secure connection.  Are you willing to proceed?"  2. Give patient instructions for WebEx download to smartphone as below if video visit  3. Advise patient to be prepared with any vital sign or heart rhythm information, their current medicines, and a piece of paper and pen handy for any instructions they may receive the day of their visit  4. Inform patient they will receive a phone call 15 minutes prior to their appointment time (may be from unknown caller ID) so they should be prepared to answer  5. Confirm that appointment type is correct in Epic appointment notes (video vs telephone)    TELEPHONE CALL NOTE  Haley Sanders has been deemed a candidate for a follow-up tele-health visit to limit community exposure during the Covid-19 pandemic. I spoke with the patient via phone to ensure availability of phone/video source, confirm preferred email & phone number, and  discuss instructions and expectations.  I reminded Haley Sanders to be prepared with any vital sign and/or heart rhythm information that could potentially be obtained via home monitoring, at the time of her visit. I reminded Haley Sanders to expect a phone call at the time of her visit if her visit.  Did the patient verbally acknowledge consent to treatment? yes  Clide Dales Hilario Robarts, CMA 07/05/2018 1:27 PM   DOWNLOADING THE WEBEX SOFTWARE TO SMARTPHONE  - If Apple, go to [employer] and type in WebEx in the search bar. Download [employer], the blue/green circle. The app is free but as with any other app downloads, their phone may require them to verify saved payment information or Apple password. The patient does NOT have to create an account.  - If Android, ask patient to go to [employer] and type in WebEx in the search bar. Download [employer], the blue/green circle. The app is free but as with any other app downloads, their phone may require them to verify saved payment information or Android password. The patient does NOT have to create an account.   CONSENT FOR TELE-HEALTH VISIT - PLEASE REVIEW  I hereby voluntarily request, consent and authorize [facility] and its employed or contracted physicians, physician assistants, nurse practitioners or other licensed health care professionals (the Practitioner), to provide me with telemedicine health care services (the "Services") as deemed necessary by the treating Practitioner.  I acknowledge and consent to receive the Services by the Practitioner via telemedicine. I understand that the telemedicine visit will involve communicating with the Practitioner through live audiovisual communication technology and the disclosure of certain medical information by electronic transmission. I acknowledge that I have been given the opportunity to request an in-person assessment or other available alternative prior to the  telemedicine visit and am voluntarily participating in the telemedicine visit.  I understand that I have the right to withhold or withdraw my consent to the use of telemedicine in the course of my care at any time, without affecting my right to future care or treatment, and that the Practitioner or I may terminate the telemedicine visit at any time. I understand that I have the right to inspect all information obtained and/or recorded in the course of the telemedicine visit and may receive copies of available information for a reasonable fee.  I understand that some of the potential risks of receiving the Services via telemedicine include:  Marland Kitchen Delay or interruption in medical evaluation due to technological equipment failure or disruption; . Information transmitted may not be sufficient (e.g. poor resolution of images) to allow for appropriate medical decision making by the Practitioner; and/or  . In rare instances, security protocols could fail, causing a breach of personal health information.  Furthermore, I acknowledge that it is my responsibility to provide information about my medical history, conditions and care that is complete and accurate to the best of my ability. I acknowledge that Practitioner's advice, recommendations, and/or decision may be based on factors not within their control, such as incomplete or inaccurate data provided by me or distortions of diagnostic images or specimens that may result from electronic transmissions. I understand that the practice of medicine is not an exact science and that Practitioner makes no warranties or guarantees regarding treatment outcomes. I acknowledge that I will receive a copy of this consent concurrently upon execution via email to the email address I last provided but may also request a printed copy by calling the office of [place].    I understand that my insurance will be billed for this visit.   I have read or had this consent read to me.  . I understand the contents of this consent, which adequately explains the benefits and risks of the Services being provided via telemedicine.  . I have been provided ample opportunity to ask questions regarding this consent and the Services and have had my questions answered to my satisfaction. . I give my informed consent for the services to be provided through the use of telemedicine in my medical care  By participating in this telemedicine visit I agree to the above.

## 2018-07-09 ENCOUNTER — Telehealth (INDEPENDENT_AMBULATORY_CARE_PROVIDER_SITE_OTHER): Payer: Medicare Other | Admitting: Interventional Cardiology

## 2018-07-09 ENCOUNTER — Other Ambulatory Visit: Payer: Self-pay

## 2018-07-09 ENCOUNTER — Encounter: Payer: Self-pay | Admitting: Interventional Cardiology

## 2018-07-09 DIAGNOSIS — M25472 Effusion, left ankle: Secondary | ICD-10-CM

## 2018-07-09 DIAGNOSIS — I110 Hypertensive heart disease with heart failure: Secondary | ICD-10-CM

## 2018-07-09 DIAGNOSIS — I5032 Chronic diastolic (congestive) heart failure: Secondary | ICD-10-CM

## 2018-07-09 DIAGNOSIS — I421 Obstructive hypertrophic cardiomyopathy: Secondary | ICD-10-CM | POA: Diagnosis not present

## 2018-07-09 DIAGNOSIS — M25471 Effusion, right ankle: Secondary | ICD-10-CM | POA: Diagnosis not present

## 2018-07-09 DIAGNOSIS — E119 Type 2 diabetes mellitus without complications: Secondary | ICD-10-CM

## 2018-07-09 NOTE — Patient Instructions (Signed)

## 2018-07-09 NOTE — Progress Notes (Signed)
Virtual Visit via Video Note   This visit type was conducted due to national recommendations for restrictions regarding the COVID-19 Pandemic (e.g. social distancing) in an effort to limit this patient's exposure and mitigate transmission in our community.  Due to her co-morbid illnesses, this patient is at least at moderate risk for complications without adequate follow up.  This format is felt to be most appropriate for this patient at this time.  All issues noted in this document were discussed and addressed.  A limited physical exam was performed with this format.  Please refer to the patient's chart for her consent to telehealth for [facility]   Switched to telephone visit due to poor internet connection  Evaluation Performed:  Follow-up visit  Date:  07/09/2018   ID:  Haley Sanders, DOB 03-13-1933, MRN [id number]  Patient Location: Home Provider Location: Home  PCP:  Chilton Greathouse, MD  Cardiologist:  No primary care provider on file.  Electrophysiologist:  None   Chief Complaint:  Diastolic heart failure  History of Present Illness:    Haley Sanders is a 85 y.o. female with Who has a h/o chronic diastolic heart failure. She had normal LV function.   She was on oxygen for a while as well. She was taken off of oxygen.   Denies recent : Chest pain. Dizziness. Nitroglycerin use. Orthopnea. Palpitations. Paroxysmal nocturnal dyspnea. Shortness of breath. Syncope.   In Jan 2020, she had an elevated BP reading.  She had a change made to her BP meds.  BP has been better.     She has had some ankle edema.  Swelling worse at the end of the day.  She has not increased salt intake.  She has not been exercising, and sitting more.    The patient does not have symptoms concerning for COVID-19 infection (fever, chills, cough, or new shortness of breath).    Past Medical History:  Diagnosis Date  . Depression   . Diabetes mellitus   . GERD (gastroesophageal  reflux disease)   . Hypercholesterolemia   . Hypertension   . Hypothyroidism   . Memory deficit   . Osteoporosis    Past Surgical History:  Procedure Laterality Date  . ABDOMINAL HYSTERECTOMY  2001  . FOOT SURGERY  1990   right foot  . ROTATOR CUFF REPAIR  1991   right  . ROTATOR CUFF REPAIR  2001   left side  . TONSILLECTOMY  1959  . TUBAL LIGATION  1975     Current Meds  Medication Sig  . albuterol (PROVENTIL HFA;VENTOLIN HFA) 108 (90 Base) MCG/ACT inhaler Inhale 2 puffs into the lungs every 6 (six) hours as needed for wheezing or shortness of breath.   Marland Kitchen aspirin 81 MG tablet Take 81 mg by mouth daily.   Marland Kitchen atorvastatin (LIPITOR) 40 MG tablet Take 40 mg by mouth daily.  . Azelastine HCl 0.15 % SOLN as needed.  Marland Kitchen buPROPion (ZYBAN) 150 MG 12 hr tablet Take 150 mg by mouth 2 (two) times daily.  . cloNIDine (CATAPRES) 0.1 MG tablet Take 1 tablet (0.1 mg total) by mouth 2 (two) times daily for 14 days.  Marland Kitchen donepezil (ARICEPT) 10 MG tablet Take 10 mg by mouth at bedtime.   Marland Kitchen esomeprazole (NEXIUM) 40 MG capsule Take 40 mg by mouth 2 (two) times daily before a meal.   . fish oil-omega-3 fatty acids 1000 MG capsule Take 1 g by mouth daily.   Marland Kitchen FLUoxetine (PROZAC) 40 MG  capsule Take 40 mg by mouth daily.  . hydrochlorothiazide (HYDRODIURIL) 25 MG tablet Take 1 tablet (25 mg total) by mouth daily for 30 days.  Marland Kitchen levocetirizine (XYZAL) 5 MG tablet Take 5 mg by mouth every evening.   Marland Kitchen levothyroxine (SYNTHROID, LEVOTHROID) 100 MCG tablet Take 100 mcg by mouth daily before breakfast.   . metFORMIN (GLUCOPHAGE-XR) 500 MG 24 hr tablet Take 500 mg by mouth daily with breakfast.   . montelukast (SINGULAIR) 10 MG tablet Take 10 mg by mouth at bedtime.  . Mouthwashes (BIOTENE/CALCIUM PBF) LIQD Take one tablespoon by mouth (15ml) and rinse solution for 30 seconds then spit out (Patient taking differently: 15 mLs by Mouth Rinse route See admin instructions. Rinse daily for 30 seconds, using 15 ml's,  then spit out- as directed)  . ONE TOUCH ULTRA TEST test strip 1 each by Other route as needed for other.   . psyllium (REGULOID) 0.52 G capsule Take 0.52 g by mouth 2 (two) times daily.   . verapamil (CALAN) 120 MG tablet Take 60 mg by mouth 2 (two) times daily.   . vitamin E 600 UNIT capsule Take 600 Units by mouth daily.     Allergies:   Codeine; Grass extracts [gramineae pollens]; and Mold extract [trichophyton]   Social History   Tobacco Use  . Smoking status: Former Smoker    Packs/day: 1.00    Years: 33.00    Pack years: 33.00    Last attempt to quit: 04/22/1988    Years since quitting: 30.2  . Smokeless tobacco: Never Used  Substance Use Topics  . Alcohol use: No  . Drug use: No     Family Hx: The patient's family history includes Heart attack in her father, maternal grandfather, and paternal grandmother. There is no history of Breast cancer.  ROS:   Please see the history of present illness.    Back pain All other systems reviewed and are negative.   Prior CV studies:   The following studies were reviewed today:  ER records from Jan 2020 reviewed; Jan 2020, 10/19 labs reviewed  Labs/Other Tests and Data Reviewed: 2   EKG:  An ECG dated 1./17/2020 was personally reviewed today and demonstrated:  NSR, no ST changes  Recent Labs: 04/09/2018: ALT 20; BUN 14; Creatinine, Ser 0.91; Hemoglobin 12.3; Platelets 207; Potassium 4.2; Sodium 140   Recent Lipid Panel No results found for: CHOL, TRIG, HDL, CHOLHDL, LDLCALC, LDLDIRECT  Wt Readings from Last 3 Encounters:  07/09/18 161 lb (73 kg)  03/29/18 164 lb 6.4 oz (74.6 kg)  01/01/18 166 lb (75.3 kg)     Objective:    Vital Signs:  BP (!) 157/84   Pulse (!) 55   Ht 5' 1.5" (1.562 m)   Wt 161 lb (73 kg)   BMI 29.93 kg/m    VITAL SIGNS:  reviewed GEN:  no acute distress RESPIRATORY:  no apparent shortness of breath exam limited due to phone format  ASSESSMENT & PLAN:    1. Chronic diastolic heart  failure: Likely close to euvolemic.  Low salt diet.  COntinue HCTZ.  Increase exercise may help her BP.   2. HOCM: 38 mm Hg gradient in the LV in the past.  Avoid vasodilators.  3. Hypertensive heart disease: .Continue meds.  Avoid salt.  Increase exercise.  Readings have been improved since her visit to the ER.  Meds limited by bradycardia.  Try to see if with lifestyle changes, she can get BP < 150/90  consistently. If BP stays elevated, could consider adding low dose amlodipine in addition to the verapamil, despite the fact that they are both calcium channel blockers.  4. DM: A1C 6.2 in 03/2018.  Avoid processed foods.  5. Ankle edema: Increased activity may help with edema and BP.  Elevate ankles at night.    COVID-19 Education: The signs and symptoms of COVID-19 were discussed with the patient and how to seek care for testing (follow up with PCP or arrange E-visit).  The importance of social distancing was discussed today.  Time:   Today, I have spent 15 minutes with the patient with telehealth technology discussing the above problems.     Medication Adjustments/Labs and Tests Ordered: Current medicines are reviewed at length with the patient today.  Concerns regarding medicines are outlined above.   Tests Ordered: No orders of the defined types were placed in this encounter.   Medication Changes: No orders of the defined types were placed in this encounter.   Disposition:  Follow up in 1 year(s)  Signed, Lance MussJayadeep Blessyn [place], MD  07/09/2018 1:52 PM    [facility]

## 2018-08-13 ENCOUNTER — Telehealth (HOSPITAL_COMMUNITY): Payer: Self-pay | Admitting: *Deleted

## 2018-08-13 NOTE — Telephone Encounter (Signed)
Attempted to reach patient to schedule appointment with our lab.

## 2018-08-17 ENCOUNTER — Telehealth (HOSPITAL_COMMUNITY): Payer: Self-pay | Admitting: Rehabilitation

## 2018-08-18 NOTE — Telephone Encounter (Signed)
The above patient or their representative was contacted and gave the following answers to these questions:         Do you have any of the following symptoms? No  Fever                    Cough                   Shortness of breath  Do  you have any of the following other symptoms? No   muscle pain         vomiting,        diarrhea        rash         weakness        red eye        abdominal pain         bruising          bruising or bleeding              joint pain           severe headache    Have you been in contact with someone who was or has been sick in the past 2 weeks? No  Yes                 Unsure                         Unable to assess   Does the person that you were in contact with have any of the following symptoms?   Cough         shortness of breath           muscle pain         vomiting,            diarrhea            rash            weakness           fever            red eye           abdominal pain           bruising  or  bleeding                joint pain                severe headache               Have you  or someone you have been in contact with traveled internationally in th last month? No        If yes, which countries?   Have you  or someone you have been in contact with traveled outside [place] in th last month? No         If yes, which state and city?   COMMENTS OR ACTION PLAN FOR THIS PATIENT:          

## 2018-08-19 ENCOUNTER — Other Ambulatory Visit (HOSPITAL_COMMUNITY): Payer: Self-pay | Admitting: Internal Medicine

## 2018-08-19 ENCOUNTER — Other Ambulatory Visit: Payer: Self-pay

## 2018-08-19 ENCOUNTER — Ambulatory Visit (HOSPITAL_COMMUNITY)
Admission: RE | Admit: 2018-08-19 | Discharge: 2018-08-19 | Disposition: A | Discharge status: Home / Self Care | Payer: Medicare Other | Source: Ambulatory Visit | Attending: Family | Admitting: Family

## 2018-08-19 DIAGNOSIS — I809 Phlebitis and thrombophlebitis of unspecified site: Secondary | ICD-10-CM | POA: Insufficient documentation

## 2018-08-19 DIAGNOSIS — I80292 Phlebitis and thrombophlebitis of other deep vessels of left lower extremity: Secondary | ICD-10-CM

## 2019-01-28 ENCOUNTER — Emergency Department (HOSPITAL_BASED_OUTPATIENT_CLINIC_OR_DEPARTMENT_OTHER): Payer: Medicare Other

## 2019-01-28 ENCOUNTER — Emergency Department (HOSPITAL_BASED_OUTPATIENT_CLINIC_OR_DEPARTMENT_OTHER)
Admission: EM | Admit: 2019-01-28 | Discharge: 2019-01-28 | Disposition: A | Discharge status: Home / Self Care | Payer: Medicare Other | Attending: Emergency Medicine | Admitting: Emergency Medicine

## 2019-01-28 ENCOUNTER — Encounter (HOSPITAL_BASED_OUTPATIENT_CLINIC_OR_DEPARTMENT_OTHER): Payer: Self-pay

## 2019-01-28 ENCOUNTER — Other Ambulatory Visit: Payer: Self-pay

## 2019-01-28 DIAGNOSIS — Z23 Encounter for immunization: Secondary | ICD-10-CM | POA: Diagnosis not present

## 2019-01-28 DIAGNOSIS — Y92009 Unspecified place in unspecified non-institutional (private) residence as the place of occurrence of the external cause: Secondary | ICD-10-CM | POA: Diagnosis not present

## 2019-01-28 DIAGNOSIS — I10 Essential (primary) hypertension: Secondary | ICD-10-CM | POA: Diagnosis not present

## 2019-01-28 DIAGNOSIS — E119 Type 2 diabetes mellitus without complications: Secondary | ICD-10-CM | POA: Diagnosis not present

## 2019-01-28 DIAGNOSIS — Z79899 Other long term (current) drug therapy: Secondary | ICD-10-CM | POA: Diagnosis not present

## 2019-01-28 DIAGNOSIS — S0083XA Contusion of other part of head, initial encounter: Secondary | ICD-10-CM | POA: Insufficient documentation

## 2019-01-28 DIAGNOSIS — Y999 Unspecified external cause status: Secondary | ICD-10-CM | POA: Insufficient documentation

## 2019-01-28 DIAGNOSIS — E039 Hypothyroidism, unspecified: Secondary | ICD-10-CM | POA: Diagnosis not present

## 2019-01-28 DIAGNOSIS — S0181XA Laceration without foreign body of other part of head, initial encounter: Secondary | ICD-10-CM | POA: Diagnosis not present

## 2019-01-28 DIAGNOSIS — Y939 Activity, unspecified: Secondary | ICD-10-CM | POA: Insufficient documentation

## 2019-01-28 DIAGNOSIS — Z87891 Personal history of nicotine dependence: Secondary | ICD-10-CM | POA: Diagnosis not present

## 2019-01-28 DIAGNOSIS — W19XXXA Unspecified fall, initial encounter: Secondary | ICD-10-CM | POA: Diagnosis not present

## 2019-01-28 DIAGNOSIS — S0990XA Unspecified injury of head, initial encounter: Secondary | ICD-10-CM | POA: Diagnosis present

## 2019-01-28 DIAGNOSIS — Z7982 Long term (current) use of aspirin: Secondary | ICD-10-CM | POA: Insufficient documentation

## 2019-01-28 MED ORDER — TETANUS-DIPHTH-ACELL PERTUSSIS 5-2.5-18.5 LF-MCG/0.5 IM SUSP
0.5000 mL | Freq: Once | INTRAMUSCULAR | Status: AC
  Administered 2019-01-28: 0.5 mL via INTRAMUSCULAR
  Filled 2019-01-28: qty 0.5

## 2019-01-28 NOTE — ED Notes (Signed)
Pt ambulatory to BR with slow steady gait- stand by assistance- wheelchair provided and pt taken to imaging. Family at bedside.

## 2019-01-28 NOTE — Discharge Instructions (Signed)
Your CT scans and xray of the knee were very reassuring - there were no signs of bleeding in the brain, fractures of the skull or face, fractures of the neck, or fractures of the right knee   We used skin glue to close up the superficial laceration on your cheek - please do not wet this area for 24 hours to allow the glue to dry properly. Afterwards you may shower and wash your face as you normally would.   Return to the ED IMMEDIATELY for any signs of infection to the laceration including redness/swelling around the laceration itself, drainage of pus, or fevers > 100.4.  Please also return to the ED for any signs of confusion, speech difficulties, vomiting, weakness or numbness on one side of the body, passing out, severe headache.

## 2019-01-28 NOTE — ED Provider Notes (Signed)
[facility]ER[facility]ENC[facility] DEPARTMENT Provider Note   CSN: [id number] Arrival date & time: 01/28/19  1541     History   Chief Complaint Chief Complaint  Patient presents with  . Fall    HPI Haley Sanders is a 85 y.o. female with PMHx depression, diabetes, GERD, HTN, hypothyroidism, osteoporosis, and memory deficit who presents to the ED today s/p mechanical fall that occurred just PTA.  Per is at bedside with patient.  States that they were walking into patient's townhouse when patient tripped over the 1 step going into the townhouse and fell, landing onto her right side.  No loss of consciousness.  Daughter reports that patient has small laceration on the right side of her face that was bleeding, bleeding controlled currently.  Patient states she thinks she may be up-to-date on her tetanus but is not quite sure.  She is currently complaining of some pain to the right side of her head where she hit her head.  She is not anticoagulated.  She is also complaining of pain to her bilateral knees as she reports she fell onto her knees.  Reports that patient has been falling more frequently as of late and is currently in the process of moving in with daughter so that they can keep a closer eye on her.  Typically ambulates without assistance and without a walker/cane.  Denies nausea, vomiting, blurry/double vision, confusion, any other associated symptoms.  Daughter states patient is acting at baseline.      The history is provided by the patient and a relative.    Past Medical History:  Diagnosis Date  . Depression   . Diabetes mellitus   . GERD (gastroesophageal reflux disease)   . Hypercholesterolemia   . Hypertension   . Hypothyroidism   . Memory deficit   . Osteoporosis     Patient Active Problem List   Diagnosis Date Noted  . Diastolic dysfunction without heart failure 12/19/2015  . Essential hypertension 12/19/2015  . Asthma 09/01/2007  . Sleep apnea 09/01/2007     Past Surgical History:  Procedure Laterality Date  . ABDOMINAL HYSTERECTOMY  2001  . FOOT SURGERY  1990   right foot  . [place]   right  . ROTATOR CUFF REPAIR  2001   left side  . TONSILLECTOMY  1959  . TUBAL LIGATION  1975     OB History   No obstetric history on file.      Home Medications    Prior to Admission medications   Medication Sig Start Date End Date Taking? Authorizing Provider  albuterol (PROVENTIL HFA;VENTOLIN HFA) 108 (90 Base) MCG/ACT inhaler Inhale 2 puffs into the lungs every 6 (six) hours as needed for wheezing or shortness of breath.     [provider]  aspirin 81 MG tablet Take 81 mg by mouth daily.     [provider]  atorvastatin (LIPITOR) 40 MG tablet Take 40 mg by mouth daily.    [provider]  Azelastine HCl 0.15 % SOLN as needed. 06/29/18   [provider]  buPROPion (ZYBAN) 150 MG 12 hr tablet Take 150 mg by mouth 2 (two) times daily.    [provider]  cloNIDine (CATAPRES) 0.1 MG tablet Take 1 tablet (0.1 mg total) by mouth 2 (two) times daily for 14 days. 04/10/18 07/09/18  Fatima Blank, MD  donepezil (ARICEPT) 10 MG tablet Take 10 mg by mouth at bedtime.     [provider]  esomeprazole (NEXIUM) 40 MG capsule Take 40 mg by mouth 2 (two) times daily before a meal.     [provider]  fish oil-omega-3 fatty acids 1000 MG capsule Take 1 g by mouth daily.     [provider]  FLUoxetine (PROZAC) 40 MG capsule Take 40 mg by mouth daily.    [provider]  hydrochlorothiazide (HYDRODIURIL) 25 MG tablet Take 1 tablet (25 mg total) by mouth daily for 30 days. 04/10/18 07/09/18  Nira Conn, MD  levocetirizine (XYZAL) 5 MG tablet Take 5 mg by mouth every evening.  12/07/17   [provider]  levothyroxine (SYNTHROID, LEVOTHROID) 100 MCG tablet Take 100 mcg by mouth daily before breakfast.  11/19/17   [provider]   metFORMIN (GLUCOPHAGE-XR) 500 MG 24 hr tablet Take 500 mg by mouth daily with breakfast.  07/02/17   [provider]  montelukast (SINGULAIR) 10 MG tablet Take 10 mg by mouth at bedtime.    [provider]  Mouthwashes (BIOTENE/CALCIUM PBF) LIQD Take one tablespoon by mouth (15ml) and rinse solution for 30 seconds then spit out Patient taking differently: 15 mLs by Mouth Rinse route See admin instructions. Rinse daily for 30 seconds, using 15 ml's, then spit out- as directed 03/29/18   Bevelyn Ngo, NP  ONE TOUCH ULTRA TEST test strip 1 each by Other route as needed for other.  11/16/15   [provider]  psyllium (REGULOID) 0.52 G capsule Take 0.52 g by mouth 2 (two) times daily.     [provider]  verapamil (CALAN) 120 MG tablet Take 60 mg by mouth 2 (two) times daily.     [provider]  vitamin E 600 UNIT capsule Take 600 Units by mouth daily.    [provider]    Family History Family History  Problem Relation Age of Onset  . Heart attack Father   . Heart attack Maternal Grandfather   . Heart attack Paternal Grandmother   . Breast cancer Neg Hx     Social History Social History   Tobacco Use  . Smoking status: Former Smoker    Packs/day: 1.00    Years: 33.00    Pack years: 33.00    Quit date: 04/22/1988    Years since quitting: 30.7  . Smokeless tobacco: Never Used  Substance Use Topics  . Alcohol use: No  . Drug use: No     Allergies   Codeine, Grass extracts [gramineae pollens], and Mold extract [trichophyton]   Review of Systems Review of Systems  Constitutional: Negative for chills and fever.  HENT: Negative for congestion.   Eyes: Negative for visual disturbance.  Respiratory: Negative for cough and shortness of breath.   Cardiovascular: Negative for chest pain.  Gastrointestinal: Negative for abdominal pain, nausea and vomiting.  Genitourinary: Negative for difficulty urinating.  Musculoskeletal:  Positive for arthralgias. Negative for joint swelling.  Skin: Positive for wound.  Neurological: Positive for headaches. Negative for dizziness, syncope and light-headedness.     Physical Exam Updated Vital Signs BP (!) 189/86 (BP Location: Left Arm)   Pulse 93   Temp 98.3 F (36.8 C) (Oral)   Resp 18   Ht  (1.549 m)   Wt 72.6 kg   SpO2 98%   BMI 30.23 kg/m   Physical Exam Vitals signs and nursing note reviewed.  Constitutional:      Appearance: She is not ill-appearing or diaphoretic.  HENT:  Head: Normocephalic.      Comments: Hematoma noted to right supraorbital area with ecchymosis and TTP. Ecchymosis noted around orbital rim on right side with small superficial laceration noted at 7 o'clock.   EOMs. PERRL. No hyphema noted. No pain with EOM.     Right Ear: Tympanic membrane normal.     Left Ear: Tympanic membrane normal.  Eyes:     Conjunctiva/sclera: Conjunctivae normal.  Neck:     Musculoskeletal: Normal range of motion and neck supple.     Comments: No C midline spinal tenderness Cardiovascular:     Rate and Rhythm: Normal rate and regular rhythm.     Pulses: Normal pulses.  Pulmonary:     Effort: Pulmonary effort is normal.     Breath sounds: Normal breath sounds. No wheezing, rhonchi or rales.  Abdominal:     Palpations: Abdomen is soft.     Tenderness: There is no abdominal tenderness. There is no guarding or rebound.  Musculoskeletal:     Comments: No T or L midline spinal tenderness.   Abrasions noted to bilateral knees; TTP to medial aspect of right knee; minimal tenderness noted to abrasion of left knee; ROM intact throughout bilateral knees. Negative anterior and posterior drawer tests bilaterally. No varus or valgus laxity bilaterally.   No tenderness to all other joints including shoulders, elbows, wrists, hips, and ankles. 2+ radial and DP pulses.   Skin:    General: Skin is warm and dry.  Neurological:     Mental Status: She is alert.      Comments: CN 3-12 grossly intact A&O x4 GCS 15 Sensation and strength intact Coordination with finger-to-nose WNL Neg pronator drift      ED Treatments / Results  Labs (all labs ordered are listed, but only abnormal results are displayed) Labs Reviewed - No data to display  EKG None  Radiology Ct Head Wo Contrast  Result Date: 01/28/2019 CLINICAL DATA:  Fall with facial strike EXAM: CT HEAD WITHOUT CONTRAST CT MAXILLOFACIAL WITHOUT CONTRAST CT CERVICAL SPINE WITHOUT CONTRAST TECHNIQUE: Multidetector CT imaging of the head, cervical spine, and maxillofacial structures were performed using the standard protocol without intravenous contrast. Multiplanar CT image reconstructions of the cervical spine and maxillofacial structures were also generated. COMPARISON:  CT neck 03/12/2001 (report only) FINDINGS: CT HEAD FINDINGS Brain: No evidence of acute infarction, hemorrhage, hydrocephalus, extra-axial collection or mass lesion/mass effect. Symmetric prominence of the ventricles, cisterns and sulci compatible with parenchymal volume loss. Patchy areas of white matter hypoattenuation are most compatible with chronic microvascular angiopathy. Senescent mineralization in the basal ganglia. Coarse dural calcifications seen along the tentorium. Vascular: Atherosclerotic calcification of the carotid siphons. No hyperdense vessel. Skull: There is right frontal and supraorbital scalp swelling and small subgaleal hematoma measuring up to 4 mm in maximal thickness. No subjacent calvarial fracture Other: None CT MAXILLOFACIAL FINDINGS Osseous: No fracture of the bony orbits. Nasal bones are intact. No mid face fractures are seen. The pterygoid plates are intact. The mandible is intact. Temporomandibular joints are normally aligned with mild arthrosis. No temporal bone fractures are identified. No fractured or avulsed teeth. Complete absence of the maxillary dentition with denture apparatus in place. Numerous  absent mandibular teeth with metallic dental bridge in resulting streak artifact. Orbits: Right supraorbital soft tissue swelling with associated thickening and swelling of the right palpebral and infraorbital soft tissues as well. No stranding or thickening is seen in the retro septal fat planes. There is prior right lens extraction. The  globes appear otherwise normal and symmetric. Symmetric appearance of the extraocular musculature and optic nerve sheath complexes. Normal caliber of the superior ophthalmic veins. Sinuses: Paranasal sinuses and mastoid air cells are predominantly clear. The middle ear cavities and external auditory canals are clear. Ossicular chains are normally configured. Soft tissues: Right frontal and supraorbital, upper and lower palpebral and malar swelling. Question some mild right pre mandibular soft tissue thickening and possible abrasion, correlate visually. No soft tissue gas or foreign body. CT CERVICAL SPINE FINDINGS Alignment: Reversal of normal cervical lordosis in the lower lumbar levels. Mild stepwise anterolisthesis of C3-C5 is on a likely degenerative basis with facet hypertrophic changes at these levels. Craniocervical and atlantoaxial articulations are normally aligned. No traumatic listhesis or abnormal facet widening. Skull base and vertebrae: No acute fracture. No primary bone lesion or focal pathologic process. Soft tissues and spinal canal: No pre or paravertebral fluid or swelling. No visible canal hematoma. Mild interspinous mineralization C3-C5 is nonspecific, and can be seen as a senescent change. Disc levels: Multilevel intervertebral disc height loss with spondylitic endplate changes. Maximal features at C6-7 with a posterior disc osteophyte complex resulting in mild canal stenosis. Uncinate spurring and facet hypertrophic changes resulting in mild multilevel neural foraminal narrowing throughout the cervical spine. Upper chest: Medial lucency in the right lung apex  is favored to reflect paraseptal emphysema with additional emphysematous changes in both apices. Some more patchy opacity in the posterior right apex is favored to reflect atelectasis. Other: Postsurgical changes from prior thyroidectomy. Slight tortuosity of the trachea as it enters day thoracic inlet IMPRESSION: 1. No acute intracranial abnormality. 2. Mild parenchymal volume loss and chronic microvascular ischemic white matter disease. 3. Right frontal swelling and small subgaleal hematoma without subjacent calvarial fracture. 4. Right supraorbital, upper and lower palpebral and malar swelling without evidence of facial fracture. 5. Question some mild right pre mandibular soft tissue thickening and possible abrasion, correlate visually. 6. No acute cervical spine fracture or traumatic listhesis. 7. Multilevel degenerative changes throughout the cervical spine. 8.  Emphysema (ICD10-J43.9). 9. Postsurgical changes at the base the neck likely reflecting prior thyroidectomy. 10. Tortuosity of the trachea as it enters the thoracic inlet Electronically Signed   By: Kreg Shropshire M.D.   On: 01/28/2019 17:23   Ct Cervical Spine Wo Contrast  Result Date: 01/28/2019 CLINICAL DATA:  Fall with facial strike EXAM: CT HEAD WITHOUT CONTRAST CT MAXILLOFACIAL WITHOUT CONTRAST CT CERVICAL SPINE WITHOUT CONTRAST TECHNIQUE: Multidetector CT imaging of the head, cervical spine, and maxillofacial structures were performed using the standard protocol without intravenous contrast. Multiplanar CT image reconstructions of the cervical spine and maxillofacial structures were also generated. COMPARISON:  CT neck 03/12/2001 (report only) FINDINGS: CT HEAD FINDINGS Brain: No evidence of acute infarction, hemorrhage, hydrocephalus, extra-axial collection or mass lesion/mass effect. Symmetric prominence of the ventricles, cisterns and sulci compatible with parenchymal volume loss. Patchy areas of white matter hypoattenuation are most  compatible with chronic microvascular angiopathy. Senescent mineralization in the basal ganglia. Coarse dural calcifications seen along the tentorium. Vascular: Atherosclerotic calcification of the carotid siphons. No hyperdense vessel. Skull: There is right frontal and supraorbital scalp swelling and small subgaleal hematoma measuring up to 4 mm in maximal thickness. No subjacent calvarial fracture Other: None CT MAXILLOFACIAL FINDINGS Osseous: No fracture of the bony orbits. Nasal bones are intact. No mid face fractures are seen. The pterygoid plates are intact. The mandible is intact. Temporomandibular joints are normally aligned with mild arthrosis. No temporal bone fractures  are identified. No fractured or avulsed teeth. Complete absence of the maxillary dentition with denture apparatus in place. Numerous absent mandibular teeth with metallic dental bridge in resulting streak artifact. Orbits: Right supraorbital soft tissue swelling with associated thickening and swelling of the right palpebral and infraorbital soft tissues as well. No stranding or thickening is seen in the retro septal fat planes. There is prior right lens extraction. The globes appear otherwise normal and symmetric. Symmetric appearance of the extraocular musculature and optic nerve sheath complexes. Normal caliber of the superior ophthalmic veins. Sinuses: Paranasal sinuses and mastoid air cells are predominantly clear. The middle ear cavities and external auditory canals are clear. Ossicular chains are normally configured. Soft tissues: Right frontal and supraorbital, upper and lower palpebral and malar swelling. Question some mild right pre mandibular soft tissue thickening and possible abrasion, correlate visually. No soft tissue gas or foreign body. CT CERVICAL SPINE FINDINGS Alignment: Reversal of normal cervical lordosis in the lower lumbar levels. Mild stepwise anterolisthesis of C3-C5 is on a likely degenerative basis with facet  hypertrophic changes at these levels. Craniocervical and atlantoaxial articulations are normally aligned. No traumatic listhesis or abnormal facet widening. Skull base and vertebrae: No acute fracture. No primary bone lesion or focal pathologic process. Soft tissues and spinal canal: No pre or paravertebral fluid or swelling. No visible canal hematoma. Mild interspinous mineralization C3-C5 is nonspecific, and can be seen as a senescent change. Disc levels: Multilevel intervertebral disc height loss with spondylitic endplate changes. Maximal features at C6-7 with a posterior disc osteophyte complex resulting in mild canal stenosis. Uncinate spurring and facet hypertrophic changes resulting in mild multilevel neural foraminal narrowing throughout the cervical spine. Upper chest: Medial lucency in the right lung apex is favored to reflect paraseptal emphysema with additional emphysematous changes in both apices. Some more patchy opacity in the posterior right apex is favored to reflect atelectasis. Other: Postsurgical changes from prior thyroidectomy. Slight tortuosity of the trachea as it enters day thoracic inlet IMPRESSION: 1. No acute intracranial abnormality. 2. Mild parenchymal volume loss and chronic microvascular ischemic white matter disease. 3. Right frontal swelling and small subgaleal hematoma without subjacent calvarial fracture. 4. Right supraorbital, upper and lower palpebral and malar swelling without evidence of facial fracture. 5. Question some mild right pre mandibular soft tissue thickening and possible abrasion, correlate visually. 6. No acute cervical spine fracture or traumatic listhesis. 7. Multilevel degenerative changes throughout the cervical spine. 8.  Emphysema (ICD10-J43.9). 9. Postsurgical changes at the base the neck likely reflecting prior thyroidectomy. 10. Tortuosity of the trachea as it enters the thoracic inlet Electronically Signed   By: Kreg Shropshire M.D.   On: 01/28/2019 17:23    Dg Knee Complete 4 Views Right  Result Date: 01/28/2019 CLINICAL DATA:  Knee pain after fall EXAM: RIGHT KNEE - COMPLETE 4+ VIEW COMPARISON:  None. FINDINGS: No evidence of fracture, dislocation, or joint effusion. No evidence of arthropathy or other focal bone abnormality. Soft tissues are unremarkable. IMPRESSION: Negative. Electronically Signed   By: Jasmine Pang M.D.   On: 01/28/2019 17:17   Ct Maxillofacial Wo Cm  Result Date: 01/28/2019 CLINICAL DATA:  Fall with facial strike EXAM: CT HEAD WITHOUT CONTRAST CT MAXILLOFACIAL WITHOUT CONTRAST CT CERVICAL SPINE WITHOUT CONTRAST TECHNIQUE: Multidetector CT imaging of the head, cervical spine, and maxillofacial structures were performed using the standard protocol without intravenous contrast. Multiplanar CT image reconstructions of the cervical spine and maxillofacial structures were also generated. COMPARISON:  CT neck 03/12/2001 (report  only) FINDINGS: CT HEAD FINDINGS Brain: No evidence of acute infarction, hemorrhage, hydrocephalus, extra-axial collection or mass lesion/mass effect. Symmetric prominence of the ventricles, cisterns and sulci compatible with parenchymal volume loss. Patchy areas of white matter hypoattenuation are most compatible with chronic microvascular angiopathy. Senescent mineralization in the basal ganglia. Coarse dural calcifications seen along the tentorium. Vascular: Atherosclerotic calcification of the carotid siphons. No hyperdense vessel. Skull: There is right frontal and supraorbital scalp swelling and small subgaleal hematoma measuring up to 4 mm in maximal thickness. No subjacent calvarial fracture Other: None CT MAXILLOFACIAL FINDINGS Osseous: No fracture of the bony orbits. Nasal bones are intact. No mid face fractures are seen. The pterygoid plates are intact. The mandible is intact. Temporomandibular joints are normally aligned with mild arthrosis. No temporal bone fractures are identified. No fractured or avulsed  teeth. Complete absence of the maxillary dentition with denture apparatus in place. Numerous absent mandibular teeth with metallic dental bridge in resulting streak artifact. Orbits: Right supraorbital soft tissue swelling with associated thickening and swelling of the right palpebral and infraorbital soft tissues as well. No stranding or thickening is seen in the retro septal fat planes. There is prior right lens extraction. The globes appear otherwise normal and symmetric. Symmetric appearance of the extraocular musculature and optic nerve sheath complexes. Normal caliber of the superior ophthalmic veins. Sinuses: Paranasal sinuses and mastoid air cells are predominantly clear. The middle ear cavities and external auditory canals are clear. Ossicular chains are normally configured. Soft tissues: Right frontal and supraorbital, upper and lower palpebral and malar swelling. Question some mild right pre mandibular soft tissue thickening and possible abrasion, correlate visually. No soft tissue gas or foreign body. CT CERVICAL SPINE FINDINGS Alignment: Reversal of normal cervical lordosis in the lower lumbar levels. Mild stepwise anterolisthesis of C3-C5 is on a likely degenerative basis with facet hypertrophic changes at these levels. Craniocervical and atlantoaxial articulations are normally aligned. No traumatic listhesis or abnormal facet widening. Skull base and vertebrae: No acute fracture. No primary bone lesion or focal pathologic process. Soft tissues and spinal canal: No pre or paravertebral fluid or swelling. No visible canal hematoma. Mild interspinous mineralization C3-C5 is nonspecific, and can be seen as a senescent change. Disc levels: Multilevel intervertebral disc height loss with spondylitic endplate changes. Maximal features at C6-7 with a posterior disc osteophyte complex resulting in mild canal stenosis. Uncinate spurring and facet hypertrophic changes resulting in mild multilevel neural  foraminal narrowing throughout the cervical spine. Upper chest: Medial lucency in the right lung apex is favored to reflect paraseptal emphysema with additional emphysematous changes in both apices. Some more patchy opacity in the posterior right apex is favored to reflect atelectasis. Other: Postsurgical changes from prior thyroidectomy. Slight tortuosity of the trachea as it enters day thoracic inlet IMPRESSION: 1. No acute intracranial abnormality. 2. Mild parenchymal volume loss and chronic microvascular ischemic white matter disease. 3. Right frontal swelling and small subgaleal hematoma without subjacent calvarial fracture. 4. Right supraorbital, upper and lower palpebral and malar swelling without evidence of facial fracture. 5. Question some mild right pre mandibular soft tissue thickening and possible abrasion, correlate visually. 6. No acute cervical spine fracture or traumatic listhesis. 7. Multilevel degenerative changes throughout the cervical spine. 8.  Emphysema (ICD10-J43.9). 9. Postsurgical changes at the base the neck likely reflecting prior thyroidectomy. 10. Tortuosity of the trachea as it enters the thoracic inlet Electronically Signed   By: Kreg ShropshirePrice  DeHay M.D.   On: 01/28/2019 17:23  Procedures Procedures (including critical care time)  Medications Ordered in ED Medications  Tdap (BOOSTRIX) injection 0.5 mL (0.5 mLs Intramuscular Given 01/28/19 1639)     Initial Impression / Assessment and Plan / ED Course  I have reviewed the triage vital signs and the nursing notes.  Pertinent labs & imaging results that were available during my care of the patient were reviewed by me and considered in my medical decision making (see chart for details).    85 year old female presents to the ED today status post mechanical fall occurred just prior to arrival.  Her at bedside.  Reports that patient tripped over the threshold into the townhouse.  No loss of consciousness.  Patient has  superficial laceration along the orbital rim on the right side with tenderness to palpation.  Extraocular movements intact without pain.  No hyphema or orbital trauma noted on exam.  Hematoma noted to the supraorbital area on the right as well.  Patient is not on blood thinners but given her age feel CT scans are appropriate at this time including CT head, CT C-spine, CT maxillofacial.  Also complaining of some pain to right knee, will obtain x-ray.  She is unsure of tetanus status.  Will administer in the ED.   Xray of knee negative.  CT scan of head, maxillofacial, C-spine without any acute abnormalities today.  I have closed laceration on right side of face with Dermabond.  Return precautions including signs of infection have been discussed with patient and daughter.  Return precautions including confusion, speech difficulties, vomiting, unilateral weakness or numbness, V or headache, syncope also discussed.  Patient and daughter advised to follow-up with their PCP.  Daughter to discuss need for PT and OT with PCP.  I have offered to order this for patient in the ED but she states she would like to speak with PCP as he knows patient better.  Feel this is appropriate.  Patient stable for discharge at this time.   This note was prepared using Dragon voice recognition software and may include unintentional dictation errors due to the inherent limitations of voice recognition software.      Final Clinical Impressions(s) / ED Diagnoses   Final diagnoses:  Fall, initial encounter  Injury of head, initial encounter  Contusion of other part of head, initial encounter  Facial laceration, initial encounter    ED Discharge Orders    None       Tanda Rockers, PA-C 01/28/19 1806    Vanetta Mulders, MD 02/01/19 1819

## 2019-01-28 NOTE — ED Triage Notes (Signed)
Pt states she tripped/fell ~1hour PTA-bruising and swelling noted around right eye-denies LOC-states she has "scrapes on my knees and my toes"-NAD-steady slow gait

## 2019-01-28 NOTE — ED Provider Notes (Signed)
Medical screening examination/treatment/procedure(s) were conducted as a shared visit with non-physician practitioner(s) and myself.  I personally evaluated the patient during the encounter.    Patient seen by me along with physician assistant.  Patient tripped and fell about an hour prior to arrival striking the right side of her head forehead and face on some bricks outside.  She also scraped up both knees.  Her main complaint is a swelling to her head.  She also has a 1.5 cm laceration under the right eye.  No neck pain.  Has some abrasions to both knees.  Patient initially thought her tetanus was up-to-date and that she was not so sure.  We do not have any confirmation of that.  So will be updated.  Patient will receive CT evaluation to make sure no head injury or blood on the brain.  Or skull fracture.  The wound under the right eye will probably heal on its own without any intervention.   Fredia Sorrow, MD 01/28/19 1659

## 2019-03-03 ENCOUNTER — Other Ambulatory Visit: Payer: Self-pay

## 2019-03-03 ENCOUNTER — Ambulatory Visit: Payer: Medicare Other | Attending: Sports Medicine | Admitting: Physical Therapy

## 2019-03-03 ENCOUNTER — Encounter: Payer: Self-pay | Admitting: Physical Therapy

## 2019-03-03 DIAGNOSIS — R2689 Other abnormalities of gait and mobility: Secondary | ICD-10-CM

## 2019-03-03 DIAGNOSIS — R293 Abnormal posture: Secondary | ICD-10-CM

## 2019-03-03 DIAGNOSIS — M436 Torticollis: Secondary | ICD-10-CM | POA: Diagnosis not present

## 2019-03-03 NOTE — Therapy (Signed)
[facility] [address] [place], [place], [address] Phone: [phone]   Fax:  [phone]  Physical Therapy Treatment  Patient Details  Name: Haley Sanders MRN: [id number] Date of Birth: November 20, 1932 Referring Provider (PT): Dr Albertha Ghee   Encounter Date: 03/03/2019  PT End of Session - 03/03/19 1144    Visit Number  1    Number of Visits  6    Date for PT Re-Evaluation  04/14/19    Authorization Type  [employer] MCR    PT Start Time  1144    PT Stop Time  1241    PT Time Calculation (min)  57 min    Activity Tolerance  Patient tolerated treatment well    Behavior During Therapy  [facility] for tasks assessed/performed       Past Medical History:  Diagnosis Date  . Depression   . Diabetes mellitus   . GERD (gastroesophageal reflux disease)   . Hypercholesterolemia   . Hypertension   . Hypothyroidism   . Memory deficit   . Osteoporosis     Past Surgical History:  Procedure Laterality Date  . ABDOMINAL HYSTERECTOMY  2001  . FOOT SURGERY  1990   right foot  . ROTATOR CUFF REPAIR  1991   right  . ROTATOR CUFF REPAIR  2001   left side  . TONSILLECTOMY  1959  . TUBAL LIGATION  1975    There were no vitals filed for this visit.  Subjective Assessment - 03/03/19 1144    Subjective  Pt is referred to [place] for neck pain that developed a couple weeks ago. This happened a couple weeks after her fall and having a head injury - she tripped on a step going up.  Didn't pick up her left leg enough.  She has been doing some exercise the doctor gave her - cervical isometrics and rotation.  She thinks it has helped some. Was on prednisone and it helped while on it and now it is gradually coming back.    Patient is accompained by:  Family member   daughter   Currently in Pain?  No/denies   at this time   Aggravating Factors   turning to the right slight pulling    Pain Relieving Factors  prednisone took the pain away.          [facility] PT Assessment - 03/03/19 0001      Assessment   Medical Diagnosis  cervical spondylosis    Referring Provider (PT)  Dr Albertha Ghee    Onset Date/Surgical Date  02/06/19    Hand Dominance  Right    Next MD Visit  PRN     Prior Therapy  in the past for her shoulders and low back.       Precautions   Precautions  --    Precaution Comments  osteoporosis      Restrictions   Other Position/Activity Restrictions  currently not doing any formal exercise.       Balance Screen   Has the patient fallen in the past 6 months  Yes    How many times?  3   one resulting in a head injury 01/28/2019   Has the patient had a decrease in activity level because of a fear of falling?   No    Is the patient reluctant to leave their home because of a fear of falling?   Yes      Home Environment  Living Environment  Private residence    [place] to enter    Home Layout  Two level   will do twice a day , takes one at a time.      Prior Function   Level of Independence  Independent    Vocation  Retired    Leisure  reading - tablet and books tolerates about 3 hrs with short breaks, sewing has more trouble with this.       Posture/Postural Control   Posture/Postural Control  Postural limitations    Postural Limitations  Rounded Shoulders;Forward head      ROM / Strength   AROM / PROM / Strength  AROM;Strength      AROM   AROM Assessment Site  Shoulder;Cervical    Right/Left Shoulder  --   bilat WNL   Cervical Flexion  to chest    Cervical Extension  35, tight/stiff on left upper trap    Cervical - Right Rotation  50    Cervical - Left Rotation  55      Strength   Strength Assessment Site  Shoulder;Elbow    Right/Left Shoulder  --   WNL except ER 4/5   Right/Left Elbow  --   WNL except Lt triceps 4-/5     Palpation   Palpation comment  tight in bilat upper traps - pt does not report tenderness      Ambulation/Gait    Ambulation Distance (Feet)  --   observed in clinic   Assistive device  None;Other (Comment)   recommend pt start using a cane   Gait Pattern  Shuffle;Ataxic      Standardized Balance Assessment   Standardized Balance Assessment  Berg Balance Test      Berg Balance Test   Sit to Stand  Able to stand without using hands and stabilize independently    Standing Unsupported  Able to stand safely 2 minutes    Sitting with Back Unsupported but Feet Supported on Floor or Stool  Able to sit safely and securely 2 minutes    Stand to Sit  Sits safely with minimal use of hands    Transfers  Able to transfer safely, minor use of hands    Standing Unsupported with Eyes Closed  Able to stand 10 seconds safely    Standing Unsupported with Feet Together  Able to place feet together independently and stand 1 minute safely    From Standing, Reach Forward with Outstretched Arm  Can reach forward >12 cm safely (5")    From Standing Position, Pick up Object from Floor  Able to pick up shoe safely and easily    From Standing Position, Turn to Look Behind Over each Shoulder  Looks behind one side only/other side shows less weight shift    Turn 360 Degrees  Able to turn 360 degrees safely but slowly   Rt 10 sec, Lt 9 sec   Standing Unsupported, Alternately Place Feet on Step/Stool  Able to complete 4 steps without aid or supervision    Standing Unsupported, One Foot in Front  Able to take small step independently and hold 30 seconds   able to complete 8 steps in 15 sec however loss balance at 5   Standing on One Leg  Tries to lift leg/unable to hold 3 seconds but remains standing independently   1 sec each side   Total Score  45  [facility] Adult PT Treatment/Exercise - 03/03/19 0001      Exercises   Exercises  Other Exercises    Other Exercises   10 reps seated cervical retraction, scapular retraction BWD shoudler rolls and standing toe taps with UE support PRN      instruction  in how to measure/size a cane and in how to ambulate with it.        PT Education - 03/03/19 1307    Education Details  HEP, POC, use of straight cane    Person(s) Educated  Patient;Child(ren)   daughter   Methods  Explanation;Demonstration;Handout    Comprehension  Returned demonstration;Verbalized understanding          PT Long Term Goals - 03/03/19 1315      PT LONG TERM GOAL #1   Title  supervision by daughter with HEP for posture and balance ( 04/14/2019)    Time  6    Period  Weeks    Status  New    Target Date  04/14/19      PT LONG TERM GOAL #2   Title  increase bilat cervical rotation =/> 60 degrees to assist with looking for obstacles while walking ( 04/14/2019)    Time  6    Period  Weeks    Status  New    Target Date  04/14/19      PT LONG TERM GOAL #3   Title  demo improved neck and thoracic posture to allow patient to sew/read without any pain or limitations ( 04/14/2019)    Time  6    Period  Weeks    Status  New    Target Date  04/14/19      PT LONG TERM GOAL #4   Title  improve BERG score =/> 52/56  to decresae risk of falls ( 04/14/2019)    Time  6    Period  Weeks    Status  New    Target Date  04/14/19            Plan - 03/03/19 1308    Clinical Impression Statement  85 yo female who fell the beginning of November sustaining a head injury.  About two weeks after the fall she began to report neck pain.  The patient has since moved in with her daughter for closer supervision. She was on a 5 day dose of prednisone and this resolved almost all of her pain. She does present with decreased cervical ROM, reports of tightness in her available ROM, postural changes and decresaed balance per observation and socre on BERG test.  She would benefit from PT to address her limited neck motion and postural changes as well as address balance defecits to decrease risk of falling and reinjury.    Personal Factors and Comorbidities  Comorbidity 3+;Age;Fitness     Comorbidities  osteoporosis, DM, depression, HTN, memory defecit, old Rt foot surgery and bilat RTC repairs    Examination-Activity Limitations  Bathing;Locomotion Level;Dressing;Carry;Stairs    Examination-Participation Restrictions  Community Activity;Other    Stability/Clinical Decision Making  Stable/Uncomplicated    Clinical Decision Making  Low    Rehab Potential  Good    PT Frequency  1x / week   per daughters request as she works and needs to bring her mother   PT Treatment/Interventions  Taping;Patient/family education;Functional mobility training;Moist Heat;Stair training;Ultrasound;Therapeutic activities;Therapeutic exercise;Cryotherapy;DME Instruction;Neuromuscular re-education;Balance training;Manual techniques    PT Next Visit Plan  postural correction exercise and balance work  Consulted and Agree with Plan of Care  Patient;Family member/caregiver    Family Member Consulted  daughter that she lives with       Patient will benefit from skilled therapeutic intervention in order to improve the following deficits and impairments:  Decreased range of motion, Increased muscle spasms, Decreased balance, Decreased knowledge of use of DME, Postural dysfunction  Visit Diagnosis: Stiffness of cervical spine - Plan: PT plan of care cert/re-cert  Abnormal posture - Plan: PT plan of care cert/re-cert  Other abnormalities of gait and mobility - Plan: PT plan of care cert/re-cert     Problem List Patient Active Problem List   Diagnosis Date Noted  . Diastolic dysfunction without heart failure 12/19/2015  . Essential hypertension 12/19/2015  . Asthma 09/01/2007  . Sleep apnea 09/01/2007    Roderic ScarceSusan Myanna Ziesmer PT  03/03/2019, 1:21 PM  [facility] [address] [place], [place], [address] Phone: [phone]   Fax:  [phone]  Name: Haley Sanders MRN: [id number] Date of Birth: Aug 09, 1932

## 2019-03-03 NOTE — Patient Instructions (Signed)
Shoulder Shrug / Circle    Bring shoulders up toward ears and back down. Then circle shoulders backward. Repeat __10__ times. Do ___3_ sessions per day.  Flexibility: Neck Retraction    Pull head straight back, keeping eyes and jaw level. Hold 3-5 sec. Repeat __10__ times per set. Do __1__ sets per session. Do __3__ sessions per day.   Scapular Retraction (Standing)    With arms at sides, pinch shoulder blades together. Hold 3-5 sec Repeat _10___ times per set. Do __1__ sets per session. Do _3___ sessions per day.  Balance: Leg Flexion,- Anterio-Lateral (Varied Surfaces)  PERFORM AT A COUNTER FOR SAFETY   Stand on left leg,. Reach other leg forward diagonally, foot close to floor. Maintain balance and constant speed. Repeat _10___ times per set. Do __2__ sets per session. Do _7___ sessions per week. Pretend your foot is tapping on crackers and you don't want to break them.

## 2019-03-10 ENCOUNTER — Other Ambulatory Visit: Payer: Self-pay

## 2019-03-10 ENCOUNTER — Ambulatory Visit: Payer: Medicare Other | Admitting: Physical Therapy

## 2019-03-10 DIAGNOSIS — R2689 Other abnormalities of gait and mobility: Secondary | ICD-10-CM

## 2019-03-10 DIAGNOSIS — R293 Abnormal posture: Secondary | ICD-10-CM

## 2019-03-10 DIAGNOSIS — M436 Torticollis: Secondary | ICD-10-CM | POA: Diagnosis not present

## 2019-03-10 NOTE — Therapy (Signed)
[place], [place], [address] Phone: [phone]   Fax:  [phone]  Physical Therapy Treatment  Patient Details  Name: Haley Sanders MRN: [id number] Date of Birth: 1932/10/27 Referring Provider (PT): Dr Wandra Feinstein   Encounter Date: 03/10/2019  PT End of Session - 03/10/19 1743    Visit Number  2    Number of Visits  6    Date for PT Re-Evaluation  04/14/19    Authorization Type  [employer]HC MCR    PT Start Time  1656    PT Stop Time  7824    PT Time Calculation (min)  47 min       Past Medical History:  Diagnosis Date  . Depression   . Diabetes mellitus   . GERD (gastroesophageal reflux disease)   . Hypercholesterolemia   . Hypertension   . Hypothyroidism   . Memory deficit   . Osteoporosis     Past Surgical History:  Procedure Laterality Date  . ABDOMINAL HYSTERECTOMY  2001  . FOOT SURGERY  1990   right foot  . [place]   right  . ROTATOR CUFF REPAIR  2001   left side  . TONSILLECTOMY  1959  . TUBAL LIGATION  1975    There were no vitals filed for this visit.  Subjective Assessment - 03/10/19 1659    Subjective  "feeling just fine"    Currently in Pain?  No/denies                       [facility] Adult PT Treatment/Exercise - 03/10/19 0001      Exercises   Exercises  Lumbar;Shoulder      Lumbar Exercises: Aerobic   UBE (Upper Arm Bike)  L3 x 5 min      Lumbar Exercises: Standing   Other Standing Lumbar Exercises  hip ext and abd 2#, 2 x 10, diagonal taps      Lumbar Exercises: Supine   Ab Set  20 reps;2 seconds    Bridge  Compliant;20 reps;2 seconds    Other Supine Lumbar Exercises  K2C 2 x 10      Shoulder Exercises: Seated   Row  Strengthening;Both;20 reps;Theraband    Theraband Level (Shoulder Row)  Level 2 (Red)    Horizontal ABduction  Strengthening;Both;20 reps;Theraband    Theraband Level (Shoulder Horizontal  ABduction)  Level 2 (Red)    External Rotation  Strengthening;Both;20 reps;Theraband    Theraband Level (Shoulder External Rotation)  Level 2 (Red)             PT Education - 03/10/19 1746    Education Details  use of cane    Person(s) Educated  Patient;Child(ren)    Methods  Explanation;Demonstration    Comprehension  Verbalized understanding          PT Long Term Goals - 03/03/19 1315      PT LONG TERM GOAL #1   Title  supervision by daughter with HEP for posture and balance ( 04/14/2019)    Time  6    Period  Weeks    Status  New    Target Date  04/14/19      PT LONG TERM GOAL #2   Title  increase bilat cervical rotation =/> 60 degrees to assist with looking for obstacles while walking ( 04/14/2019)    Time  6    Period  Weeks  Status  New    Target Date  04/14/19      PT LONG TERM GOAL #3   Title  demo improved neck and thoracic posture to allow patient to sew/read without any pain or limitations ( 04/14/2019)    Time  6    Period  Weeks    Status  New    Target Date  04/14/19      PT LONG TERM GOAL #4   Title  improve BERG score =/> 52/56  to decresae risk of falls ( 04/14/2019)    Time  6    Period  Weeks    Status  New    Target Date  04/14/19            Plan - 03/10/19 1744    Clinical Impression Statement  pt tolerated treatment well as demonstrated by no increase of pain with interventions.pt able to perform standing hip ext/abd with weight. pt needs cues to improve form and technique of exercises. some HEP exercises were reviewed with pt and daughter as well as use of cane. pt needs rest break secondary to fatigue and SOB. pt has slight dizziness when getting up from supine.    Personal Factors and Comorbidities  Comorbidity 3+;Age;Fitness    Comorbidities  osteoporosis, DM, depression, HTN, memory defecit, old Rt foot surgery and bilat RTC repairs    Examination-Activity Limitations  Bathing;Locomotion Level;Dressing;Carry;Stairs     Examination-Participation Restrictions  Community Activity;Other    Stability/Clinical Decision Making  Stable/Uncomplicated    Clinical Decision Making  Low    Rehab Potential  Good    PT Frequency  1x / week    PT Treatment/Interventions  Taping;Patient/family education;Functional mobility training;Moist Heat;Stair training;Ultrasound;Therapeutic activities;Therapeutic exercise;Cryotherapy;DME Instruction;Neuromuscular re-education;Balance training;Manual techniques    PT Next Visit Plan  postural correction exercise and balance work       Patient will benefit from skilled therapeutic intervention in order to improve the following deficits and impairments:  Decreased range of motion, Increased muscle spasms, Decreased balance, Decreased knowledge of use of DME, Postural dysfunction  Visit Diagnosis: Abnormal posture  Other abnormalities of gait and mobility     Problem List Patient Active Problem List   Diagnosis Date Noted  . Diastolic dysfunction without heart failure 12/19/2015  . Essential hypertension 12/19/2015  . Asthma 09/01/2007  . Sleep apnea 09/01/2007    Paulina Fusi, SPTA 03/10/2019, 5:49 PM  [facility] [address] [place], [place], [address] Phone: [phone]   Fax:  [phone]  Name: Haley Sanders MRN: [id number] Date of Birth: 02/01/1933

## 2019-03-15 ENCOUNTER — Ambulatory Visit: Payer: Medicare Other | Admitting: Physical Therapy

## 2019-03-15 ENCOUNTER — Other Ambulatory Visit: Payer: Self-pay

## 2019-03-15 DIAGNOSIS — M436 Torticollis: Secondary | ICD-10-CM

## 2019-03-15 DIAGNOSIS — R293 Abnormal posture: Secondary | ICD-10-CM

## 2019-03-15 DIAGNOSIS — R2689 Other abnormalities of gait and mobility: Secondary | ICD-10-CM

## 2019-03-15 NOTE — Therapy (Signed)
[place], [place], [address] Phone: [phone]   Fax:  [phone]  Physical Therapy Treatment  Patient Details  Name: Haley Sanders MRN: [id number] Date of Birth: [birth year] Referring Provider (PT): Dr Wandra Feinstein   Encounter Date: 03/15/2019  PT End of Session - 03/15/19 1654    Visit Number  3    Number of Visits  6    Date for PT Re-Evaluation  04/14/19    PT Start Time  1410    PT Stop Time  9450    PT Time Calculation (min)  43 min       Past Medical History:  Diagnosis Date  . Depression   . Diabetes mellitus   . GERD (gastroesophageal reflux disease)   . Hypercholesterolemia   . Hypertension   . Hypothyroidism   . Memory deficit   . Osteoporosis     Past Surgical History:  Procedure Laterality Date  . ABDOMINAL HYSTERECTOMY  2001  . FOOT SURGERY  1990   right foot  . [place]   right  . ROTATOR CUFF REPAIR  2001   left side  . TONSILLECTOMY  1959  . TUBAL LIGATION  1975    There were no vitals filed for this visit.  Subjective Assessment - 03/15/19 1608    Subjective  doing okay    Currently in Pain?  Yes    Pain Score  2     Pain Location  Back    Pain Orientation  Lower         [facility] PT Assessment - 03/15/19 0001      AROM   AROM Assessment Site  Cervical    Cervical Flexion  to chest    Cervical Extension  40    Cervical - Right Rotation  decreased 25%    Cervical - Left Rotation  WNLs                   [facility] Adult PT Treatment/Exercise - 03/15/19 0001      Exercises   Exercises  Neck      Neck Exercises: Standing   Neck Retraction  20 reps;3 secs   with head on ball on wall   Other Standing Exercises  postural wall angles 10x      Lumbar Exercises: Aerobic   UBE (Upper Arm Bike)  L 4 48mn      Shoulder Exercises: Standing   External Rotation  Strengthening;Both;10 reps;Theraband    Theraband  Level (Shoulder External Rotation)  Level 2 (Red)    Extension  Strengthening;Both;10 reps;Theraband    Theraband Level (Shoulder Extension)  Level 2 (Red)    Extension Limitations  postural cuing    Row  Strengthening;Both;10 reps;Theraband    Theraband Level (Shoulder Row)  Level 2 (Red)    Other Standing Exercises  3# shld shruggs and backward rolls 15 each             PT Education - 03/15/19 1621    Education Details  [facility] HEP for Balance Packet    Person(s) Educated  Patient    Methods  Explanation;Demonstration;Handout    Comprehension  Verbalized understanding;Returned demonstration          PT Long Term Goals - 03/15/19 1655      PT LONG TERM GOAL #1   Title  supervision by daughter with HEP for posture and balance ( 04/14/2019)  Baseline  issued for balance    Status  Partially Met      PT LONG TERM GOAL #2   Title  increase bilat cervical rotation =/> 60 degrees to assist with looking for obstacles while walking ( 04/14/2019)    Status  Partially Met      PT LONG TERM GOAL #3   Title  demo improved neck and thoracic posture to allow patient to sew/read without any pain or limitations ( 04/14/2019)    Status  On-going      PT LONG TERM GOAL #4   Title  improve BERG score =/> 52/56  to decresae risk of falls ( 04/14/2019)    Status  On-going            Plan - 03/15/19 1656    Clinical Impression Statement  pt came in with 2/10 LBP and left without pain, est HEP for balance and worked on postural ex. cuing needed and seated rest for faigue. pt amb with SPC but in clinic amb without AD with no LOB.    PT Treatment/Interventions  Taping;Patient/family education;Functional mobility training;Moist Heat;Stair training;Ultrasound;Therapeutic activities;Therapeutic exercise;Cryotherapy;DME Instruction;Neuromuscular re-education;Balance training;Manual techniques    PT Next Visit Plan  postural correction exercise and check HEP for balance       Patient will  benefit from skilled therapeutic intervention in order to improve the following deficits and impairments:  Decreased range of motion, Increased muscle spasms, Decreased balance, Decreased knowledge of use of DME, Postural dysfunction  Visit Diagnosis: Abnormal posture  Other abnormalities of gait and mobility  Stiffness of cervical spine     Problem List Patient Active Problem List   Diagnosis Date Noted  . Diastolic dysfunction without heart failure 12/19/2015  . Essential hypertension 12/19/2015  . Asthma 09/01/2007  . Sleep apnea 09/01/2007    Nola Botkins,ANGIE PTA 03/15/2019, 4:58 PM  [place], [place], [address] Phone: [phone]   Fax:  [phone]  Name: Haley Sanders MRN: [id number] Date of Birth: [birth year]

## 2019-03-21 ENCOUNTER — Encounter: Payer: Medicare Other | Admitting: Physical Therapy

## 2019-03-23 ENCOUNTER — Encounter: Payer: Self-pay | Admitting: Physical Therapy

## 2019-03-23 ENCOUNTER — Ambulatory Visit: Payer: Medicare Other | Admitting: Physical Therapy

## 2019-03-23 ENCOUNTER — Other Ambulatory Visit: Payer: Self-pay

## 2019-03-23 DIAGNOSIS — R2689 Other abnormalities of gait and mobility: Secondary | ICD-10-CM

## 2019-03-23 DIAGNOSIS — M436 Torticollis: Secondary | ICD-10-CM

## 2019-03-23 DIAGNOSIS — R293 Abnormal posture: Secondary | ICD-10-CM

## 2019-03-23 NOTE — Patient Instructions (Signed)
Access Code: [email or web address]  URL: [email or web address]  Date: 03/23/2019  Prepared by: Cheri Fowler   Exercises Standing March with Counter Support - 10 reps - 3 sets - 1x daily - 7x weekly Standing Hip Abduction with Unilateral Counter Support - 10 reps                   - 3 sets - 1x daily - 7x weekly

## 2019-03-23 NOTE — Therapy (Signed)
[place], [place], [address] Phone: [phone]   Fax:  [phone]  Physical Therapy Treatment  Patient Details  Name: Haley Sanders MRN: [id number] Date of Birth: Apr 04, 1932 Referring Provider (PT): Dr Wandra Feinstein   Encounter Date: 03/23/2019  PT End of Session - 03/23/19 1139    Visit Number  4    Date for PT Re-Evaluation  04/14/19    Authorization Type  [employer] MCR    PT Start Time  1100    PT Stop Time  1143    PT Time Calculation (min)  43 min    Activity Tolerance  Patient tolerated treatment well    Behavior During Therapy  [facility] for tasks assessed/performed       Past Medical History:  Diagnosis Date  . Depression   . Diabetes mellitus   . GERD (gastroesophageal reflux disease)   . Hypercholesterolemia   . Hypertension   . Hypothyroidism   . Memory deficit   . Osteoporosis     Past Surgical History:  Procedure Laterality Date  . ABDOMINAL HYSTERECTOMY  2001  . FOOT SURGERY  1990   right foot  . [place]   right  . ROTATOR CUFF REPAIR  2001   left side  . TONSILLECTOMY  1959  . TUBAL LIGATION  1975    There were no vitals filed for this visit.  Subjective Assessment - 03/23/19 1107    Subjective  "Im tired"    Currently in Pain?  No/denies                       [facility] Adult PT Treatment/Exercise - 03/23/19 0001      High Level Balance   High Level Balance Activities  Side stepping;Backward walking      Neck Exercises: Seated   Neck Retraction  20 reps;3 secs      Lumbar Exercises: Aerobic   UBE (Upper Arm Bike)  L2 x min each way    Nustep  L4 x 5 min       Lumbar Exercises: Standing   Other Standing Lumbar Exercises  Standing marcha nd abduction Bilat UE support 2x10       Lumbar Exercises: Seated   Sit to Stand  10 reps      Shoulder Exercises: Standing   External Rotation  Strengthening;Both;10  reps;Theraband    Theraband Level (Shoulder External Rotation)  Level 2 (Red)    Extension  Strengthening;Both;10 reps;Theraband    Theraband Level (Shoulder Extension)  Level 2 (Red)    Extension Limitations  postural cuing    Row  Strengthening;Both;10 reps;Theraband    Theraband Level (Shoulder Row)  Level 2 (Red)                  PT Long Term Goals - 03/15/19 1655      PT LONG TERM GOAL #1   Title  supervision by daughter with HEP for posture and balance ( 04/14/2019)    Baseline  issued for balance    Status  Partially Met      PT LONG TERM GOAL #2   Title  increase bilat cervical rotation =/> 60 degrees to assist with looking for obstacles while walking ( 04/14/2019)    Status  Partially Met      PT LONG TERM GOAL #3   Title  demo improved neck and thoracic posture to allow  patient to sew/read without any pain or limitations ( 04/14/2019)    Status  On-going      PT LONG TERM GOAL #4   Title  improve BERG score =/> 52/56  to decresae risk of falls ( 04/14/2019)    Status  On-going            Plan - 03/23/19 1140    Clinical Impression Statement  No reports of pain only fatigue from going to bed late. Postural cues needed with rows and extensions. Ambulated in with [facility] but ambulated in clinic throughout session without AD. Cues to increase step length needed with backwards walking.    Comorbidities  osteoporosis, DM, depression, HTN, memory defecit, old Rt foot surgery and bilat RTC repairs    Examination-Activity Limitations  Bathing;Locomotion Level;Dressing;Carry;Stairs    Examination-Participation Restrictions  Community Activity;Other    Stability/Clinical Decision Making  Stable/Uncomplicated    Rehab Potential  Good    PT Frequency  1x / week    PT Treatment/Interventions  Taping;Patient/family education;Functional mobility training;Moist Heat;Stair training;Ultrasound;Therapeutic activities;Therapeutic exercise;Cryotherapy;DME Instruction;Neuromuscular  re-education;Balance training;Manual techniques    PT Next Visit Plan  postural correction exercise and check balance       Patient will benefit from skilled therapeutic intervention in order to improve the following deficits and impairments:  Decreased range of motion, Increased muscle spasms, Decreased balance, Decreased knowledge of use of DME, Postural dysfunction  Visit Diagnosis: Stiffness of cervical spine  Other abnormalities of gait and mobility  Abnormal posture     Problem List Patient Active Problem List   Diagnosis Date Noted  . Diastolic dysfunction without heart failure 12/19/2015  . Essential hypertension 12/19/2015  . Asthma 09/01/2007  . Sleep apnea 09/01/2007    Scot Jun, PTA 03/23/2019, 11:42 AM  [place], [place], [address] Phone: [phone]   Fax:  [phone]  Name: Haley Sanders MRN: [id number] Date of Birth: June 01, 1932

## 2019-03-28 ENCOUNTER — Ambulatory Visit: Payer: Medicare Other | Admitting: Physical Therapy

## 2019-03-31 ENCOUNTER — Encounter: Payer: Medicare Other | Admitting: Physical Therapy

## 2019-06-15 ENCOUNTER — Encounter: Payer: Self-pay | Admitting: Neurology

## 2019-06-23 ENCOUNTER — Other Ambulatory Visit: Payer: Self-pay | Admitting: Internal Medicine

## 2019-06-23 DIAGNOSIS — Z1231 Encounter for screening mammogram for malignant neoplasm of breast: Secondary | ICD-10-CM

## 2019-07-12 ENCOUNTER — Other Ambulatory Visit: Payer: Self-pay

## 2019-07-12 ENCOUNTER — Ambulatory Visit
Admission: RE | Admit: 2019-07-12 | Discharge: 2019-07-12 | Disposition: A | Discharge status: Home / Self Care | Payer: Medicare Other | Source: Ambulatory Visit | Attending: Internal Medicine | Admitting: Internal Medicine

## 2019-07-12 DIAGNOSIS — Z1231 Encounter for screening mammogram for malignant neoplasm of breast: Secondary | ICD-10-CM

## 2019-07-20 NOTE — Progress Notes (Deleted)
Cardiology Office Note   Date:  07/20/2019   ID:  Haley Sanders, DOB [birth year], MRN [id number]  PCP:  Prince Solian, MD    No chief complaint on file.  Chronic diastolic heart failure  Wt Readings from Last 3 Encounters:  01/28/19 160 lb (72.6 kg)  07/09/18 161 lb (73 kg)  03/29/18 164 lb 6.4 oz (74.6 kg)       History of Present Illness: Haley Sanders is a 86 y.o. female  with Who has a h/o chronic diastolic heart failure. She had normal LV function.   She was on oxygen for a while as well. She was taken off of oxygen.  Denies recent : Chest pain. Dizziness. Nitroglycerin use. Orthopnea. Palpitations. Paroxysmal nocturnal dyspnea. Shortness of breath. Syncope.   In Jan 2020, she had an elevated BP reading.  She had a change made to her BP meds.  BP has been better.     She has had some ankle edema.  Swelling worse at the end of the day.  She has not increased salt intake.  She has not been exercising, and sitting more.      Past Medical History:  Diagnosis Date  . Depression   . Diabetes mellitus   . GERD (gastroesophageal reflux disease)   . Hypercholesterolemia   . Hypertension   . Hypothyroidism   . Memory deficit   . Osteoporosis     Past Surgical History:  Procedure Laterality Date  . ABDOMINAL HYSTERECTOMY  2001  . FOOT SURGERY  1990   right foot  . [place]   right  . ROTATOR CUFF REPAIR  2001   left side  . TONSILLECTOMY  1959  . TUBAL LIGATION  1975     Current Outpatient Medications  Medication Sig Dispense Refill  . albuterol (PROVENTIL HFA;VENTOLIN HFA) 108 (90 Base) MCG/ACT inhaler Inhale 2 puffs into the lungs every 6 (six) hours as needed for wheezing or shortness of breath.     Marland Kitchen aspirin 81 MG tablet Take 81 mg by mouth daily.     Marland Kitchen atorvastatin (LIPITOR) 40 MG tablet Take 40 mg by mouth daily.    . Azelastine HCl 0.15 % SOLN as needed.    Marland Kitchen buPROPion (ZYBAN) 150 MG 12 hr tablet Take 150  mg by mouth 2 (two) times daily.    . cloNIDine (CATAPRES) 0.1 MG tablet Take 1 tablet (0.1 mg total) by mouth 2 (two) times daily for 14 days. 28 tablet 0  . donepezil (ARICEPT) 10 MG tablet Take 10 mg by mouth at bedtime.     Marland Kitchen esomeprazole (NEXIUM) 40 MG capsule Take 40 mg by mouth 2 (two) times daily before a meal.     . fish oil-omega-3 fatty acids 1000 MG capsule Take 1 g by mouth daily.     Marland Kitchen FLUoxetine (PROZAC) 40 MG capsule Take 40 mg by mouth daily.    . hydrochlorothiazide (HYDRODIURIL) 25 MG tablet Take 1 tablet (25 mg total) by mouth daily for 30 days. 30 tablet 0  . levocetirizine (XYZAL) 5 MG tablet Take 5 mg by mouth every evening.     Marland Kitchen levothyroxine (SYNTHROID, LEVOTHROID) 100 MCG tablet Take 100 mcg by mouth daily before breakfast.     . metFORMIN (GLUCOPHAGE-XR) 500 MG 24 hr tablet Take 500 mg by mouth daily with breakfast.   3  . montelukast (SINGULAIR) 10 MG tablet Take 10 mg by mouth at bedtime.    Marland Kitchen  Mouthwashes (BIOTENE/CALCIUM PBF) LIQD Take one tablespoon by mouth (59ml) and rinse solution for 30 seconds then spit out (Patient taking differently: 15 mLs by Mouth Rinse route See admin instructions. Rinse daily for 30 seconds, using 15 ml's, then spit out- as directed) 450 mL 0  . ONE TOUCH ULTRA TEST test strip 1 each by Other route as needed for other.     . psyllium (REGULOID) 0.52 G capsule Take 0.52 g by mouth 2 (two) times daily.     . verapamil (CALAN) 120 MG tablet Take 60 mg by mouth 2 (two) times daily.     . vitamin E 600 UNIT capsule Take 600 Units by mouth daily.     No current facility-administered medications for this visit.    Allergies:   Codeine, Grass extracts [gramineae pollens], and Mold extract [trichophyton]    Social History:  The patient  reports that she quit smoking about 31 years ago. She has a 33.00 pack-year smoking history. She has never used smokeless tobacco. She reports that she does not drink alcohol or use drugs.   Family History:   The patient's ***family history includes Heart attack in her father, maternal grandfather, and paternal grandmother.    ROS:  Please see the history of present illness.   Otherwise, review of systems are positive for ***.   All other systems are reviewed and negative.    PHYSICAL EXAM: VS:  There were no vitals taken for this visit. , BMI There is no height or weight on file to calculate BMI. GEN: Well nourished, well developed, in no acute distress HEENT: normal Neck: no JVD, carotid bruits, or masses Cardiac: ***RRR; no murmurs, rubs, or gallops,no edema  Respiratory:  clear to auscultation bilaterally, normal work of breathing GI: soft, nontender, nondistended, + BS MS: no deformity or atrophy Skin: warm and dry, no rash Neuro:  Strength and sensation are intact Psych: euthymic mood, full affect   EKG:   The ekg ordered today demonstrates ***   Recent Labs: No results found for requested labs within last 8760 hours.   Lipid Panel No results found for: CHOL, TRIG, HDL, CHOLHDL, VLDL, LDLCALC, LDLDIRECT   Other studies Reviewed: Additional studies/ records that were reviewed today with results demonstrating: ***.   ASSESSMENT AND PLAN:  1. Chronic diastolic heart failure:  2. HOCM: 3. Hypertensive heart disease: 4. DM: 5. Ankle edema:   Current medicines are reviewed at length with the patient today.  The patient concerns regarding her medicines were addressed.  The following changes have been made:  No change***  Labs/ tests ordered today include: *** No orders of the defined types were placed in this encounter.   Recommend 150 minutes/week of aerobic exercise Low fat, low carb, high fiber diet recommended  Disposition:   FU in ***   Signed, Lance Muss, MD  07/20/2019 11:28 PM    [facility] [address] [place], [place], [place]  [address] Phone: [phone]; Fax: [phone]

## 2019-07-21 ENCOUNTER — Ambulatory Visit: Payer: Medicare Other | Admitting: Interventional Cardiology

## 2019-07-22 NOTE — Progress Notes (Signed)
Assessment/Plan:   1.  Parkinsonism  -I had a long counseling session with the patient today.  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  I explained that one clinically cannot tell the difference between idiopathic parkinsons disease and secondary parkinsonism from medication.  I also explained that even if one is able to get off of the medication, it can take up to 6 months to clinically definitively know if this is idiopathic parkinsons disease.  I did not advise that the patient go off of medication, as this needs to be discussed with the patients prescribing physician.  I did, however, tell the patient that the longer one is on the medication, the worse the symptoms can get.  The patient is to make an appointment with Avva, Ravisankar, MD to discuss what I have discussed with her.  2.  Memory change  -not convinced completely that pt has dementia.  She was able to provide her rather elaborate history without much trouble.  We did discuss neurocognitive testing.  However, I would not recommend that right now while potentially her aripiprazole is going to be changed.  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  I would recommend that she undergo some adjustment of her mood medication, and then perhaps she can consider neurocognitive testing.  She does not need an appointment with me to have that done.  She is going to discuss that further with her primary care physician.  3.  Told the patient that if she is able to get off of aripiprazole and still has tremor/parkinsonian symptoms 6 months after that (and is on no other dopamine blockers) that I would like her to make a follow-up appointment with me.  She and her daughter were agreeable.   Subjective:   Haley Sanders was seen today in the movement disorders clinic for neurologic consultation at the request of Avva, Ravisankar, MD.   The consultation is for the evaluation of tremor.  This patient is accompanied in the office by her daughter who supplements the history. Multiple previous records made available to me are reviewed, including records from primary care.  Patient is a 86 year old female with a history of peripheral neuropathy, heart failure, dementia, chronic renal disease, sleep apnea, mood disorder who presents for the evaluation of tremor.   Specific Symptoms:  Tremor: Yes.     Tremor inducing meds:  Yes - abilify (been on since 04/2018), albuterol (1 time per month), singulair  How long has it been going on? 1.5 - 2 years ago  At rest or with activation?  Resting  Fam hx of tremor?  No.  Located where?  R>L hand  Affected by caffeine:  No. (drinks 2 cups per day)  Affected by alcohol:  No. (doesn't drink EtOH)  Affected by stress:  Yes.   per daughter but no per pt  Affected by fatigue:  No.  Spills soup if on spoon:  No.    Other sxs: Voice: little weaker Sleep: awakens to use the RR  Vivid Dreams:  No.  Acting out dreams:  No. Wet Pillows: Yes.   Postural symptoms:  Yes.   - "I feel like I am going to fall when I just stand up so I often lean on the counter."  Used cane since November - recommended by PT  Falls?  Yes.  , last fall was in Nov, 2020 - ?tripped going  up into townhome - doesn't think that picked up L foot enough Bradykinesia symptoms: slow movements and difficulty getting out of a chair; shuffle per daughter but pt doesn't think that she shuffles Loss of smell:  No. Loss of taste:  No. per pt but yes per daughter Urinary Incontinence:  Yes.  , stress incontinence Difficulty Swallowing:  No. Handwriting, micrographia: No. but has trouble with handwriting due to arthritis Trouble with ADL's:  No.  Trouble buttoning clothing: Yes.   b/c of arthritis in hands (daughter not so sure - thinks may be more tremor/fine motor coordination related) Depression:  Yes.  , sometimes Memory changes:   Yes.  , trouble with word recall/names.  Does own pill box and able to remember to take own meds.  Lives independently by self in apartment but the place does meals and has a concerige; daughter states that when pt cannot do something b/c of tremor she will get flustered and then not be able to do something but otherwise memory seems okay.   Hallucinations:  No.  visual distortions: No. N/V:  No. Lightheaded:  No.  Syncope: No. Diplopia:  No. Dyskinesia:  No.   Neuroimaging of the brain has  previously been performed.  It is available for my review today.  Pt had ct brain in 01/2019 after a fall where she hit her face.  Intracranially, it was fairly unremarkable with the exception of mild WMD and atrophy.    ALLERGIES:   Allergies  Allergen Reactions  . Codeine Other (See Comments)    FEELS  EXTREMELY "LOOPY"  . Grass Extracts [Gramineae Pollens] Itching and Other (See Comments)    Runny nose, itchy eyes, allergic symptoms  . Mold Extract [Trichophyton] Other (See Comments)    Takes allergy shots for this- sinus infections/congestion    CURRENT MEDICATIONS:  Current Outpatient Medications  Medication Instructions  . albuterol (PROVENTIL HFA;VENTOLIN HFA) 108 (90 Base) MCG/ACT inhaler 2 puffs, Inhalation, Every 6 hours PRN  . ARIPiprazole (ABILIFY) 2 MG tablet 1 tablet, Oral, Daily  . aspirin 81 mg, Oral, Daily  . atorvastatin (LIPITOR) 40 mg, Oral, Daily  . Azelastine HCl 0.15 % SOLN As needed  . buPROPion (ZYBAN) 150 mg, Oral, 2 times daily  . cloNIDine (CATAPRES) 0.1 mg, Oral, 2 times daily  . donepezil (ARICEPT) 10 mg, Oral, Daily at bedtime  . esomeprazole (NEXIUM) 40 mg, Oral, 2 times daily before meals  . fish oil-omega-3 fatty acids 1 g, Oral, Daily  . FLUoxetine (PROZAC) 40 mg, Oral, Daily  . hydrochlorothiazide (HYDRODIURIL) 25 mg, Oral, Daily  . levocetirizine (XYZAL) 5 mg, Oral, Every evening  . levothyroxine (SYNTHROID) 100 mcg, Oral, Daily before breakfast  .  metFORMIN (GLUCOPHAGE-XR) 500 mg, Oral, Daily with breakfast  . montelukast (SINGULAIR) 10 mg, Oral, Daily at bedtime  . Mouthwashes (BIOTENE/CALCIUM PBF) LIQD Take one tablespoon by mouth (91ml) and rinse solution for 30 seconds then spit out  . ONE TOUCH ULTRA TEST test strip 1 each, Other, As needed  . psyllium (REGULOID) 0.52 g, Oral, 2 times daily  . verapamil (CALAN) 60 mg, Oral, 2 times daily  . vitamin E 600 Units, Oral, Daily    Objective:   VITALS:   Vitals:   07/25/19 1240  BP: (!) 176/77  Pulse: (!) 58  SpO2: 97%  Weight: 164 lb (74.4 kg)  Height: 5' (1.524 m)    GEN:  The patient appears stated age and is in NAD. HEENT:  Normocephalic, atraumatic.  The  mucous membranes are moist. The superficial temporal arteries are without ropiness or tenderness. CV:  [place] Foley.  regular Lungs:  CTAB Neck/HEME:  There are no carotid bruits bilaterally.  Neurological examination:  Orientation: The patient is alert and oriented x3.  Cranial nerves: There is good facial symmetry. Extraocular muscles are intact. The visual fields are full to confrontational testing. The speech is fluent and clear. Soft palate rises symmetrically and there is no tongue deviation. Hearing is intact to conversational tone. Sensation: Sensation is intact to light and pinprick throughout (facial, trunk, extremities). Vibration is intact at the bilateral big toe. There is no extinction with double simultaneous stimulation. There is no sensory dermatomal level identified. Motor: Strength is 5/5 in the bilateral upper and lower extremities.   Shoulder shrug is equal and symmetric.  There is no pronator drift. Deep tendon reflexes: Deep tendon reflexes are 2-/4 at the bilateral biceps, triceps, brachioradialis, patella and achilles. Plantar responses are downgoing bilaterally.  Movement examination: Tone: There is mid increased tone in the RUE only. Abnormal movements:  there is bilateral UE rest tremor that is  independent of one another Coordination:  There is decremation with RAM's, especially with hand opening and closing and finger taps bilaterally, but also with toe taps on the left Gait and Station: The patient has no difficulty arising out of a deep-seated chair without the use of the hands. The patient's stride length is decreased with decreased arm swing.  She is somewhat unstable in the turn (she had her cane with her today but did not use it when demonstrating her walk).   I have reviewed and interpreted the following labs independently   Chemistry      Component Value Date/Time   NA 140 04/09/2018 2108   K 4.2 04/09/2018 2108   CL 104 04/09/2018 2108   CO2 25 04/09/2018 2108   BUN 14 04/09/2018 2108   CREATININE 0.91 04/09/2018 2108      Component Value Date/Time   CALCIUM 9.4 04/09/2018 2108   ALKPHOS 44 04/09/2018 2108   AST 22 04/09/2018 2108   ALT 20 04/09/2018 2108   BILITOT 0.6 04/09/2018 2108      No results found for: TSH Lab Results  Component Value Date   WBC 7.8 04/09/2018   HGB 12.3 04/09/2018   HCT 39.3 04/09/2018   MCV 91.6 04/09/2018   PLT 207 04/09/2018   Patient has had more recent blood work on June 07, 2019 that I have reviewed.  Sodium was 138, potassium 4.1, chloride 105, CO2 23, BUN 17, creatinine 0.8, glucose 114.  White blood cells were 6.9, hemoglobin 12.5, hematocrit 40.6 and platelets 223.  TSH was 0.66.  Hemoglobin A1c 7.4.  Total time spent on today's visit was 60 minutes, including both face-to-face time and nonface-to-face time.  Time included that spent on review of records (prior notes available to me/labs/imaging if pertinent), discussing treatment and goals, answering patient's questions and coordinating care.  Cc:  Chilton Greathouse, MD

## 2019-07-25 ENCOUNTER — Other Ambulatory Visit: Payer: Self-pay

## 2019-07-25 ENCOUNTER — Encounter: Payer: Self-pay | Admitting: Neurology

## 2019-07-25 ENCOUNTER — Ambulatory Visit (INDEPENDENT_AMBULATORY_CARE_PROVIDER_SITE_OTHER): Payer: Medicare PPO | Admitting: Neurology

## 2019-07-25 VITALS — BP 176/77 | HR 58 | Ht 60.0 in | Wt 164.0 lb

## 2019-07-25 DIAGNOSIS — R413 Other amnesia: Secondary | ICD-10-CM

## 2019-07-25 DIAGNOSIS — G2111 Neuroleptic induced parkinsonism: Secondary | ICD-10-CM | POA: Diagnosis not present

## 2019-07-25 NOTE — Patient Instructions (Addendum)
1.  You need to make a follow up appointment with your PCP to discuss whether or not he can find a replacement for the abilify (aripiprazole).  We discussed that you likely have secondary parkinsonism from this medication. 2.  We discussed that we do have the ability to do neurocognitive testing (memory testing) but I would want to hold on that if changing your mood medication is coming soon.  Your PCP can refer for this at our office if he feels appropriate 3.  I want to see you back IF you are able to get off of the abilify and if your tremor doesn't resolve within 6 months 4.  If you need a psychiatrist, I often use Dr. Jennelle Human at [facility] psychiatry.  The physicians and staff at [facility] are committed to providing excellent care. You may receive a survey requesting feedback about your experience at our office. We strive to receive "very good" responses to the survey questions. If you feel that your experience would prevent you from giving the office a "very good " response, please contact our office to try to remedy the situation. We may be reached at [phone]. Thank you for taking the time out of your busy day to complete the survey.

## 2019-07-26 DIAGNOSIS — M545 Low back pain: Secondary | ICD-10-CM | POA: Diagnosis not present

## 2019-07-26 DIAGNOSIS — R2689 Other abnormalities of gait and mobility: Secondary | ICD-10-CM | POA: Diagnosis not present

## 2019-07-28 DIAGNOSIS — R2689 Other abnormalities of gait and mobility: Secondary | ICD-10-CM | POA: Diagnosis not present

## 2019-07-28 DIAGNOSIS — M545 Low back pain: Secondary | ICD-10-CM | POA: Diagnosis not present

## 2019-08-02 DIAGNOSIS — R2689 Other abnormalities of gait and mobility: Secondary | ICD-10-CM | POA: Diagnosis not present

## 2019-08-02 DIAGNOSIS — M545 Low back pain: Secondary | ICD-10-CM | POA: Diagnosis not present

## 2019-08-04 DIAGNOSIS — R2689 Other abnormalities of gait and mobility: Secondary | ICD-10-CM | POA: Diagnosis not present

## 2019-08-04 DIAGNOSIS — M545 Low back pain: Secondary | ICD-10-CM | POA: Diagnosis not present

## 2019-08-11 DIAGNOSIS — N3946 Mixed incontinence: Secondary | ICD-10-CM | POA: Diagnosis not present

## 2019-08-11 DIAGNOSIS — R350 Frequency of micturition: Secondary | ICD-10-CM | POA: Diagnosis not present

## 2019-08-16 DIAGNOSIS — M545 Low back pain: Secondary | ICD-10-CM | POA: Diagnosis not present

## 2019-08-16 DIAGNOSIS — R2689 Other abnormalities of gait and mobility: Secondary | ICD-10-CM | POA: Diagnosis not present

## 2019-08-18 DIAGNOSIS — R2689 Other abnormalities of gait and mobility: Secondary | ICD-10-CM | POA: Diagnosis not present

## 2019-08-18 DIAGNOSIS — M545 Low back pain: Secondary | ICD-10-CM | POA: Diagnosis not present

## 2019-08-23 DIAGNOSIS — M545 Low back pain: Secondary | ICD-10-CM | POA: Diagnosis not present

## 2019-08-23 DIAGNOSIS — R2689 Other abnormalities of gait and mobility: Secondary | ICD-10-CM | POA: Diagnosis not present

## 2019-08-25 DIAGNOSIS — M545 Low back pain: Secondary | ICD-10-CM | POA: Diagnosis not present

## 2019-08-25 DIAGNOSIS — R2689 Other abnormalities of gait and mobility: Secondary | ICD-10-CM | POA: Diagnosis not present

## 2019-08-30 DIAGNOSIS — M545 Low back pain: Secondary | ICD-10-CM | POA: Diagnosis not present

## 2019-08-30 DIAGNOSIS — R2689 Other abnormalities of gait and mobility: Secondary | ICD-10-CM | POA: Diagnosis not present

## 2019-09-01 DIAGNOSIS — M545 Low back pain: Secondary | ICD-10-CM | POA: Diagnosis not present

## 2019-09-01 DIAGNOSIS — R2689 Other abnormalities of gait and mobility: Secondary | ICD-10-CM | POA: Diagnosis not present

## 2019-09-07 DIAGNOSIS — F39 Unspecified mood [affective] disorder: Secondary | ICD-10-CM | POA: Diagnosis not present

## 2019-09-07 DIAGNOSIS — R251 Tremor, unspecified: Secondary | ICD-10-CM | POA: Diagnosis not present

## 2019-09-07 DIAGNOSIS — I129 Hypertensive chronic kidney disease with stage 1 through stage 4 chronic kidney disease, or unspecified chronic kidney disease: Secondary | ICD-10-CM | POA: Diagnosis not present

## 2019-09-07 DIAGNOSIS — N1831 Chronic kidney disease, stage 3a: Secondary | ICD-10-CM | POA: Diagnosis not present

## 2019-09-07 DIAGNOSIS — E1140 Type 2 diabetes mellitus with diabetic neuropathy, unspecified: Secondary | ICD-10-CM | POA: Diagnosis not present

## 2019-09-07 DIAGNOSIS — F0390 Unspecified dementia without behavioral disturbance: Secondary | ICD-10-CM | POA: Diagnosis not present

## 2019-09-07 DIAGNOSIS — M5136 Other intervertebral disc degeneration, lumbar region: Secondary | ICD-10-CM | POA: Diagnosis not present

## 2019-09-22 DIAGNOSIS — J449 Chronic obstructive pulmonary disease, unspecified: Secondary | ICD-10-CM | POA: Diagnosis not present

## 2019-09-22 DIAGNOSIS — G4733 Obstructive sleep apnea (adult) (pediatric): Secondary | ICD-10-CM | POA: Diagnosis not present

## 2019-09-27 DIAGNOSIS — M25551 Pain in right hip: Secondary | ICD-10-CM | POA: Diagnosis not present

## 2019-09-27 DIAGNOSIS — M5136 Other intervertebral disc degeneration, lumbar region: Secondary | ICD-10-CM | POA: Diagnosis not present

## 2019-09-27 DIAGNOSIS — M545 Low back pain: Secondary | ICD-10-CM | POA: Diagnosis not present

## 2019-09-27 NOTE — Progress Notes (Signed)
Cardiology Office Note   Date:  09/29/2019   ID:  Haley Sanders, DOB 11/16/1932, MRN [id number]  PCP:  Chilton Greathouse, MD    No chief complaint on file.  Chronic diastolic heart failure  Wt Readings from Last 3 Encounters:  09/29/19 161 lb 6.4 oz (73.2 kg)  07/25/19 164 lb (74.4 kg)  01/28/19 160 lb (72.6 kg)       History of Present Illness: Haley Sanders is a 86 y.o. female  with Who has a h/o chronic diastolic heart failure. She had normal LV function.   She was on oxygen for a while as well. She was taken off of oxygen since then.  In Jan 2020, she had an elevated BP reading.  She had a change made to her BP meds.  BP has been better.   In the past, it was noted: "She has had some ankle edema.  Swelling worse at the end of the day. "  Swelling in ankles has persisted.  L>R.   Denies : Chest pain. Dizziness. Leg edema. Nitroglycerin use. Orthopnea. Palpitations. Paroxysmal nocturnal dyspnea. Syncope.   Back pain and hip pain limit walking.  Has some DOE.    BP at home was in the 170s systolic.  Clonidine was added by Dr. Felipa Eth,  and it was better.    COVID vaccines received.    Past Medical History:  Diagnosis Date  . Depression   . Diabetes mellitus   . GERD (gastroesophageal reflux disease)   . Hypercholesterolemia   . Hypertension   . Hypothyroidism   . Memory deficit   . Osteoporosis     Past Surgical History:  Procedure Laterality Date  . ABDOMINAL HYSTERECTOMY  2001  . FOOT SURGERY  1990   right foot  . ROTATOR CUFF REPAIR  1991   right  . ROTATOR CUFF REPAIR  2001   left side  . TONSILLECTOMY  1959  . TUBAL LIGATION  1975     Current Outpatient Medications  Medication Sig Dispense Refill  . albuterol (PROVENTIL HFA;VENTOLIN HFA) 108 (90 Base) MCG/ACT inhaler Inhale 2 puffs into the lungs every 6 (six) hours as needed for wheezing or shortness of breath.     Marland Kitchen aspirin 81 MG tablet Take 81 mg by mouth daily.     Marland Kitchen  atorvastatin (LIPITOR) 40 MG tablet Take 40 mg by mouth daily.    . Azelastine HCl 0.15 % SOLN as needed.    Marland Kitchen buPROPion (ZYBAN) 150 MG 12 hr tablet Take 150 mg by mouth 2 (two) times daily.    . cloNIDine (CATAPRES) 0.2 MG tablet Take 0.2 mg by mouth 2 (two) times daily.    Marland Kitchen donepezil (ARICEPT) 10 MG tablet Take 10 mg by mouth at bedtime.     Marland Kitchen esomeprazole (NEXIUM) 40 MG capsule Take 40 mg by mouth 2 (two) times daily before a meal.     . fish oil-omega-3 fatty acids 1000 MG capsule Take 1 g by mouth daily.     Marland Kitchen FLUoxetine (PROZAC) 40 MG capsule Take 40 mg by mouth daily.    . hydrochlorothiazide (HYDRODIURIL) 25 MG tablet Take 1 tablet (25 mg total) by mouth daily for 30 days. 30 tablet 0  . levocetirizine (XYZAL) 5 MG tablet Take 5 mg by mouth every evening.     Marland Kitchen levothyroxine (SYNTHROID, LEVOTHROID) 100 MCG tablet Take 100 mcg by mouth daily before breakfast.     . metFORMIN (GLUCOPHAGE-XR) 500 MG 24  hr tablet Take 500 mg by mouth daily with breakfast.   3  . montelukast (SINGULAIR) 10 MG tablet Take 10 mg by mouth at bedtime.    . Mouthwashes (BIOTENE/CALCIUM PBF) LIQD Take one tablespoon by mouth (49ml) and rinse solution for 30 seconds then spit out (Patient taking differently: 15 mLs by Mouth Rinse route See admin instructions. Rinse daily for 30 seconds, using 15 ml's, then spit out- as directed) 450 mL 0  . ONE TOUCH ULTRA TEST test strip 1 each by Other route as needed for other.     . psyllium (REGULOID) 0.52 G capsule Take 0.52 g by mouth 2 (two) times daily.     . verapamil (CALAN) 120 MG tablet Take 60 mg by mouth 2 (two) times daily.     . vitamin E 600 UNIT capsule Take 600 Units by mouth daily.     No current facility-administered medications for this visit.    Allergies:   Codeine, Grass extracts [gramineae pollens], and Mold extract [trichophyton]    Social History:  The patient  reports that she quit smoking about 31 years ago. She has a 33.00 pack-year smoking  history. She has never used smokeless tobacco. She reports that she does not drink alcohol and does not use drugs.   Family History:  The patient's family history includes Alzheimer's disease in her sister; CAD in her son; Cancer in her mother; Heart attack in her father, maternal grandfather, and paternal grandmother; Stroke in her father.    ROS:  Please see the history of present illness.   Otherwise, review of systems are positive for back pain.   All other systems are reviewed and negative.    PHYSICAL EXAM: VS:  BP (!) 170/86   Pulse (!) 59   Ht 5' (1.524 m)   Wt 161 lb 6.4 oz (73.2 kg)   SpO2 91%   BMI 31.52 kg/m  , BMI Body mass index is 31.52 kg/m. GEN: Well nourished, well developed, in no acute distress  HEENT: normal  Neck: no JVD, carotid bruits, or masses Cardiac: RRR; no murmurs, rubs, or gallops,ankle edema L.R Respiratory:  clear to auscultation bilaterally, normal work of breathing GI: soft, nontender, nondistended, + BS MS: no deformity or atrophy  Skin: warm and dry, no rash Neuro:  Strength and sensation are intact, bilateral pill rolling tremor Psych: euthymic mood, full affect   EKG:   The ekg ordered today demonstrates NSR, no ST changes   Recent Labs: No results found for requested labs within last 8760 hours.   Lipid Panel No results found for: CHOL, TRIG, HDL, CHOLHDL, VLDL, LDLCALC, LDLDIRECT   Other studies Reviewed: Additional studies/ records that were reviewed today with results demonstrating: LDL 94.   ASSESSMENT AND PLAN:  1. Chronic diastolic heart failure: Appears euvolemic. Work on BP control as noted below. 2. HOCM: 38 mm Hg gradient noted in the past.  No syncope.   Typically, we avoid vasodilators but given high BP, will add lisinopril.  Reasonable choice in setting of DM.  1 week BMet and PharmD check for HTN. 3. Hypertensive heart disease: Low salt diet. Avoid processed foods.  Having dry mouth with clonidine.  Add lisinopril 10  mg daily. Check BMet in a week.  Increase lisinopril as tolerated and decrease clonidine due to side effects.   Can't increase verapamil due to slow HR. 4. DM: Whole food, plant based diet. A1C 7.4 at last check. 5. Ankle edema: Elevate legs.  Likely  related to venous insufficiency. She has trouble getting on her compression stockings.   Current medicines are reviewed at length with the patient today.  The patient concerns regarding her medicines were addressed.  The following changes have been made:  Labs/ tests ordered today include:  No orders of the defined types were placed in this encounter.   Recommend 150 minutes/week of aerobic exercise Low fat, low carb, high fiber diet recommended  Disposition:   FU in 1 year   Signed, Lance Muss, MD  09/29/2019 1:55 PM    [facility] [address] [place], [place], [place]  [address] Phone: [phone]; Fax: [phone]

## 2019-09-29 ENCOUNTER — Other Ambulatory Visit: Payer: Self-pay

## 2019-09-29 ENCOUNTER — Encounter: Payer: Self-pay | Admitting: Interventional Cardiology

## 2019-09-29 ENCOUNTER — Ambulatory Visit: Payer: Medicare PPO | Admitting: Interventional Cardiology

## 2019-09-29 VITALS — BP 170/86 | HR 59 | Ht 60.0 in | Wt 161.4 lb

## 2019-09-29 DIAGNOSIS — M25472 Effusion, left ankle: Secondary | ICD-10-CM

## 2019-09-29 DIAGNOSIS — E119 Type 2 diabetes mellitus without complications: Secondary | ICD-10-CM

## 2019-09-29 DIAGNOSIS — M25471 Effusion, right ankle: Secondary | ICD-10-CM | POA: Diagnosis not present

## 2019-09-29 DIAGNOSIS — I421 Obstructive hypertrophic cardiomyopathy: Secondary | ICD-10-CM | POA: Diagnosis not present

## 2019-09-29 DIAGNOSIS — I5032 Chronic diastolic (congestive) heart failure: Secondary | ICD-10-CM

## 2019-09-29 DIAGNOSIS — I110 Hypertensive heart disease with heart failure: Secondary | ICD-10-CM

## 2019-09-29 MED ORDER — LISINOPRIL 10 MG PO TABS: 10.0000 mg | ORAL_TABLET | Freq: Every day | ORAL | 1 refills | Status: DC

## 2019-09-29 NOTE — Patient Instructions (Addendum)
Medication Instructions:  Your physician has recommended you make the following change in your medication:   Start lisinopril 10 mg tablet; take one tablet by mouth once a day.  *If you need a refill on your cardiac medications before your next appointment, please call your pharmacy*   Lab Work: Your physician recommends that you return for lab work in: one week for BMET on 10/11/19  If you have labs (blood work) drawn today and your tests are completely normal, you will receive your results only by: Marland Kitchen MyChart Message (if you have MyChart) OR . A paper copy in the mail If you have any lab test that is abnormal or we need to change your treatment, we will call you to review the results.   Testing/Procedures: NONE   Follow-Up: You have been referred to the PharmD in the Hypertension Clinic on 10/11/19 at 2:30    At [facility], you and your health needs are our priority.  As part of our continuing mission to provide you with exceptional heart care, we have created designated Provider Care Teams.  These Care Teams include your primary Cardiologist (physician) and Advanced Practice Providers (APPs -  Physician Assistants and Nurse Practitioners) who all work together to provide you with the care you need, when you need it.  We recommend signing up for the patient portal called "MyChart".  Sign up information is provided on this After Visit Summary.  MyChart is used to connect with patients for Virtual Visits (Telemedicine).  Patients are able to view lab/test results, encounter notes, upcoming appointments, etc.  Non-urgent messages can be sent to your provider as well.   To learn more about what you can do with MyChart, go to [email or web address].    Your next appointment:   12 month(s)  The format for your next appointment:   In Person  Provider:   You may see Lance Muss, MD or one of the following Advanced Practice Providers on your designated Care Team:    Ronie Spies, PA-C  Jacolyn Reedy, PA-C    Other Instructions NONE

## 2019-10-11 ENCOUNTER — Ambulatory Visit (INDEPENDENT_AMBULATORY_CARE_PROVIDER_SITE_OTHER): Payer: Medicare PPO | Admitting: Pharmacist

## 2019-10-11 ENCOUNTER — Other Ambulatory Visit: Payer: Self-pay

## 2019-10-11 ENCOUNTER — Other Ambulatory Visit: Payer: Medicare PPO

## 2019-10-11 VITALS — BP 164/80 | HR 57 | Wt 161.4 lb

## 2019-10-11 DIAGNOSIS — I110 Hypertensive heart disease with heart failure: Secondary | ICD-10-CM

## 2019-10-11 NOTE — Patient Instructions (Addendum)
It was nice meeting you today!  Your blood pressure goal is less than 130/80  Continue your hydrochlorothiazide 25 mg daily, your verapamil 120 mg twice a day, your lisnopril 10 mg daily, and your clonidine until you hear from [place]  I will call you tomorrow with your blood work results  Continue to check your blood pressure two times daily  Laural Golden, PharmD, BCACP, CDCES [facility] [address], [place], [place] [address] Phone: [phone]; Fax: [phone] 10/11/2019 3:20 PM

## 2019-10-11 NOTE — Progress Notes (Signed)
Patient ID: Haley Sanders                 DOB: 11-May-1932                      MRN: [id number]     HPI: Haley Sanders is a 86 y.o. female referred by Dr. Eldridge Dace to HTN clinic. PMH is significant for sleep apnea, DM, HLD, and depression.  Presents today in good spirits using cane.  Patient lives in an assisted living community and eats one meal in the cafeteria a day.  Reports it is occasionally salty.  Is able to cook for her self and manage her own medications.  Uses [employer] BP cuff and monitors with Ipad.  Swelling in ankles has decreased.  Past BP readings: 7/20: 146/73 7/19: 166/83, 160/85 7/18: 176/94, 116/65 7/17: 159/85, 141/71 7/16: 178/92  Is due for BMP.  Current HTN meds: clonidine 0.2 mg, HCTZ 25 mg daily, lisinopril 10 mg daily, verapamil 120 mg daily Previously tried: clonidine 0.1mg , telmisartan/HCTZ 80/25 BP goal: <130/80  Family History:  The patient's family history includes Alzheimer's disease in her sister; CAD in her son; Cancer in her mother; Heart attack in her father, maternal grandfather, and paternal grandmother; Stroke in her father.   Social History: Quit smoking in 1990  Diet: lives in retirement home, eats 1 meal in cafeteria.  Usually vegetables, meat, starchy vegetable and desert.     Exercise: goes to exercise room.  Tries to go 3 times a week.    Home BP readings:   Wt Readings from Last 3 Encounters:  09/29/19 161 lb 6.4 oz (73.2 kg)  07/25/19 164 lb (74.4 kg)  01/28/19 160 lb (72.6 kg)   BP Readings from Last 3 Encounters:  09/29/19 (!) 170/86  07/25/19 (!) 176/77  01/28/19 (!) 174/78   Pulse Readings from Last 3 Encounters:  09/29/19 (!) 59  07/25/19 (!) 58  01/28/19 65    Renal function: CrCl cannot be calculated (Patient's most recent lab result is older than the maximum 21 days allowed.).  Past Medical History:  Diagnosis Date  . Depression   . Diabetes mellitus   . GERD (gastroesophageal reflux disease)    . Hypercholesterolemia   . Hypertension   . Hypothyroidism   . Memory deficit   . Osteoporosis     Current Outpatient Medications on File Prior to Visit  Medication Sig Dispense Refill  . albuterol (PROVENTIL HFA;VENTOLIN HFA) 108 (90 Base) MCG/ACT inhaler Inhale 2 puffs into the lungs every 6 (six) hours as needed for wheezing or shortness of breath.     Marland Kitchen aspirin 81 MG tablet Take 81 mg by mouth daily.     Marland Kitchen atorvastatin (LIPITOR) 40 MG tablet Take 40 mg by mouth daily.    . Azelastine HCl 0.15 % SOLN as needed.    Marland Kitchen buPROPion (ZYBAN) 150 MG 12 hr tablet Take 150 mg by mouth 2 (two) times daily.    . cloNIDine (CATAPRES) 0.2 MG tablet Take 0.2 mg by mouth 2 (two) times daily.    Marland Kitchen donepezil (ARICEPT) 10 MG tablet Take 10 mg by mouth at bedtime.     Marland Kitchen esomeprazole (NEXIUM) 40 MG capsule Take 40 mg by mouth 2 (two) times daily before a meal.     . fish oil-omega-3 fatty acids 1000 MG capsule Take 1 g by mouth daily.     Marland Kitchen FLUoxetine (PROZAC) 40 MG capsule Take 40 mg by  mouth daily.    . hydrochlorothiazide (HYDRODIURIL) 25 MG tablet Take 1 tablet (25 mg total) by mouth daily for 30 days. 30 tablet 0  . levocetirizine (XYZAL) 5 MG tablet Take 5 mg by mouth every evening.     Marland Kitchen levothyroxine (SYNTHROID, LEVOTHROID) 100 MCG tablet Take 100 mcg by mouth daily before breakfast.     . lisinopril (ZESTRIL) 10 MG tablet Take 1 tablet (10 mg total) by mouth daily. 30 tablet 1  . metFORMIN (GLUCOPHAGE-XR) 500 MG 24 hr tablet Take 500 mg by mouth daily with breakfast.   3  . montelukast (SINGULAIR) 10 MG tablet Take 10 mg by mouth at bedtime.    . Mouthwashes (BIOTENE/CALCIUM PBF) LIQD Take one tablespoon by mouth (10ml) and rinse solution for 30 seconds then spit out (Patient taking differently: 15 mLs by Mouth Rinse route See admin instructions. Rinse daily for 30 seconds, using 15 ml's, then spit out- as directed) 450 mL 0  . ONE TOUCH ULTRA TEST test strip 1 each by Other route as needed for  other.     . psyllium (REGULOID) 0.52 G capsule Take 0.52 g by mouth 2 (two) times daily.     . verapamil (CALAN) 120 MG tablet Take 60 mg by mouth 2 (two) times daily.     . vitamin E 600 UNIT capsule Take 600 Units by mouth daily.     No current facility-administered medications on file prior to visit.    Allergies  Allergen Reactions  . Codeine Other (See Comments)    FEELS  EXTREMELY "LOOPY"  . Grass Extracts [Gramineae Pollens] Itching and Other (See Comments)    Runny nose, itchy eyes, allergic symptoms  . Mold Extract [Trichophyton] Other (See Comments)    Takes allergy shots for this- sinus infections/congestion     Assessment/Plan:  1. Hypertension - Patient BP in room today 164/80 which is above goal of <130/80.  Patient could likely benefit from increase in lisinopril, however will wait until results of pending BMP come back.  Will call patient with results and recommendation and schedule recheck at that time.  Patient voiced understanding.  Laural Golden, PharmD, BCACP, CDCES [facility] [address], [place], [place] [address] Phone: [phone]; Fax: [phone] 10/11/2019 5:14 PM

## 2019-10-12 LAB — BASIC METABOLIC PANEL
BUN/Creatinine Ratio: 23 (ref 12–28)
BUN: 21 mg/dL (ref 8–27)
CO2: 26 mmol/L (ref 20–29)
Calcium: 9.5 mg/dL (ref 8.7–10.3)
Chloride: 100 mmol/L (ref 96–106)
Creatinine, Ser: 0.92 mg/dL (ref 0.57–1.00)
GFR calc Af Amer: 66 mL/min/{1.73_m2} (ref >59)
GFR calc non Af Amer: 57 mL/min/{1.73_m2} — ABNORMAL LOW (ref >59)
Glucose: 124 mg/dL — ABNORMAL HIGH (ref 65–99)
Potassium: 4.4 mmol/L (ref 3.5–5.2)
Sodium: 139 mmol/L (ref 134–144)

## 2019-10-13 ENCOUNTER — Telehealth: Payer: Self-pay | Admitting: Pharmacist

## 2019-10-13 DIAGNOSIS — I110 Hypertensive heart disease with heart failure: Secondary | ICD-10-CM

## 2019-10-13 MED ORDER — LISINOPRIL 20 MG PO TABS: 20.0000 mg | ORAL_TABLET | Freq: Every day | ORAL | 0 refills | Status: DC

## 2019-10-13 NOTE — Telephone Encounter (Signed)
Spoke with patient regarding normal renal function.  Patient agreeable to increasing lisinopril.  Recheck appt scheduled for 8/31

## 2019-11-22 ENCOUNTER — Other Ambulatory Visit: Payer: Self-pay

## 2019-11-22 ENCOUNTER — Ambulatory Visit (INDEPENDENT_AMBULATORY_CARE_PROVIDER_SITE_OTHER): Payer: Medicare PPO | Admitting: Pharmacist

## 2019-11-22 VITALS — BP 150/87 | HR 64

## 2019-11-22 DIAGNOSIS — I110 Hypertensive heart disease with heart failure: Secondary | ICD-10-CM

## 2019-11-22 DIAGNOSIS — I10 Essential (primary) hypertension: Secondary | ICD-10-CM

## 2019-11-22 MED ORDER — LISINOPRIL 40 MG PO TABS: 40.0000 mg | ORAL_TABLET | Freq: Every day | ORAL | 3 refills | Status: DC

## 2019-11-22 NOTE — Progress Notes (Signed)
Patient ID: Haley Sanders                 DOB: [birth year]                      MRN: [id number]     HPI: Haley Sanders is a 86 y.o. female referred by Dr. Eldridge Dace to HTN clinic. PMH is significant for chronic diastolic heart failure, sleep apnea on CPAP, DM, HLD, and depression. At last visit in HTN clinic on 10/11/19, lisinopril was increased from 10 mg to 20 mg daily.  Patient presents today in good spirits ambulating with a cane. Patient lives in an assisted living community center and manages her own medications. Reports medication adherence with lisinopril in the evenings, HCTZ in the mornings, and clonidine and verapamil twice daily. Patient states she has been on verapamil for ~4 years and thinks it was started for blood pressure and cannot recall why telmisartan-HCTZ was discontinued. Denies headaches, blurred vision, SOB, and chest pain/palpitations. Reports this is not new but sometimes feel lightheaded. Additionally, reports swelling in the ankles have been the same since last clinic visit and states her physician is aware and has a scheduled visit in October. Reports also feeling sleepy, but it has gotten better; uses CPAP every night. Patient brought written BP log taken before and after medications from 8/5 to 8/29. Readings in AM before meds range from 140s-160s/70-80s with highs of 168/84, 166/84, and 168/82. Readings in the PM after meds range from 130s-160s/60-70s (see below).    Current HTN meds: lisinopril 20 mg daily, clonidine 0.2 mg BID, HCTZ 25 mg daily, verapamil 60 mg BID Previously tried: clonidine 0.1 mg, telmisartan/HCTZ 80/25 BP goal: <130/80  Family History: The patient's family history includes Alzheimer's disease in her sister; CAD in her son; Cancer in her mother; Heart attack in her father, maternal grandfather, and paternal grandmother; Stroke in her father.  Social History: Quit smoking in 1990  Diet: lives in retirement home, eats 1 meal in  cafeteria. Usually vegetables, meat, starchy vegetable and desert.     Exercise: goes to exercise room.  Tries to go 3 times a week.    Home BP readings: From August 5 to 29th  AM before Meds: 162/83, 168/84, 154/84, 16279, 155/85, 144/72, 158/80, 161/81, 162/82, 168/82, 157/88, 165/84, 166/84, 154/86, 146/78, 161/82, 153/80, 137/78 162/88, 166/84 PM after meds:164/77, 142/72, 161/1, 149/71, 142/73, 140/71, 134/69, 130/64, 139/70, 148/77, 155/77, 162/80, 151/79, 133/69, 133/64, 158/80, 157/79, 137/76, 139/76, 139/72 HR: 59-70s  Wt Readings from Last 3 Encounters:  10/11/19 161 lb 6.4 oz (73.2 kg)  09/29/19 161 lb 6.4 oz (73.2 kg)  07/25/19 164 lb (74.4 kg)   BP Readings from Last 3 Encounters:  11/22/19 (!) 150/87  10/11/19 (!) 164/80  09/29/19 (!) 170/86   Pulse Readings from Last 3 Encounters:  11/22/19 64  10/11/19 (!) 57  09/29/19 (!) 59    Renal function: CrCl cannot be calculated (Patient's most recent lab result is older than the maximum 21 days allowed.).  Past Medical History:  Diagnosis Date  . Depression   . Diabetes mellitus   . GERD (gastroesophageal reflux disease)   . Hypercholesterolemia   . Hypertension   . Hypothyroidism   . Memory deficit   . Osteoporosis     Current Outpatient Medications on File Prior to Visit  Medication Sig Dispense Refill  . albuterol (PROVENTIL HFA;VENTOLIN HFA) 108 (90 Base) MCG/ACT inhaler Inhale 2 puffs into the lungs  every 6 (six) hours as needed for wheezing or shortness of breath.     Marland Kitchen aspirin 81 MG tablet Take 81 mg by mouth daily.     Marland Kitchen atorvastatin (LIPITOR) 40 MG tablet Take 40 mg by mouth daily.    . Azelastine HCl 0.15 % SOLN as needed.    Marland Kitchen buPROPion (ZYBAN) 150 MG 12 hr tablet Take 150 mg by mouth 2 (two) times daily.    . cloNIDine (CATAPRES) 0.2 MG tablet Take 0.2 mg by mouth 2 (two) times daily.    Marland Kitchen donepezil (ARICEPT) 10 MG tablet Take 10 mg by mouth at bedtime.     Marland Kitchen esomeprazole (NEXIUM) 40 MG capsule  Take 40 mg by mouth 2 (two) times daily before a meal.     . fish oil-omega-3 fatty acids 1000 MG capsule Take 1 g by mouth daily.     Marland Kitchen FLUoxetine (PROZAC) 40 MG capsule Take 40 mg by mouth daily.    . hydrochlorothiazide (HYDRODIURIL) 25 MG tablet Take 1 tablet (25 mg total) by mouth daily for 30 days. 30 tablet 0  . levocetirizine (XYZAL) 5 MG tablet Take 5 mg by mouth every evening.     Marland Kitchen levothyroxine (SYNTHROID, LEVOTHROID) 100 MCG tablet Take 100 mcg by mouth daily before breakfast.     . metFORMIN (GLUCOPHAGE-XR) 500 MG 24 hr tablet Take 500 mg by mouth daily with breakfast.   3  . montelukast (SINGULAIR) 10 MG tablet Take 10 mg by mouth at bedtime.    . Mouthwashes (BIOTENE/CALCIUM PBF) LIQD Take one tablespoon by mouth (63ml) and rinse solution for 30 seconds then spit out (Patient taking differently: 15 mLs by Mouth Rinse route See admin instructions. Rinse daily for 30 seconds, using 15 ml's, then spit out- as directed) 450 mL 0  . ONE TOUCH ULTRA TEST test strip 1 each by Other route as needed for other.     . psyllium (REGULOID) 0.52 G capsule Take 0.52 g by mouth 2 (two) times daily.     . verapamil (CALAN) 120 MG tablet Take 60 mg by mouth 2 (two) times daily.     . vitamin E 600 UNIT capsule Take 600 Units by mouth daily.     No current facility-administered medications on file prior to visit.    Allergies  Allergen Reactions  . Codeine Other (See Comments)    FEELS  EXTREMELY "LOOPY"  . Grass Extracts [Gramineae Pollens] Itching and Other (See Comments)    Runny nose, itchy eyes, allergic symptoms  . Mold Extract [Trichophyton] Other (See Comments)    Takes allergy shots for this- sinus infections/congestion    Assessment/Plan:  1. Hypertension - Clinic BP above goal < 130/80 mmHg and home BP readings also elevated. Patient reports adherence with current medications. Will plan to increase lisinopril to 40 mg daily if BMET drawn today is within normal limits. Encouraged  patient to continue eating a healthy diet and may consider Ensure Max Protein (less carbs and sugar) or Glucerna Hunger Smart for additional meal supplementation if she does not like the meals prepared by the assisted living community center. Encouraged patient to continue exercising to the best of her abilities. Patient verbalized understanding. Will plan to see patient in two weeks to check BP and BMET again to assesselectrolytesand kidney function after lisinopril dose increase.   Fabio Neighbors, PharmD PGY2 Ambulatory Care Pharmacy Resident [facility] [address] [place], [place] [address] Phone: [phone]; Fax: [phone]   

## 2019-11-22 NOTE — Patient Instructions (Addendum)
It was nice to see you today!  Your goal blood pressure is <130/80 mmHg. In clinic, your blood pressure was 150/87 mmHg.  Medication Changes: Will call you tomorrow witth lab results and when to start taking Lisinopril 40 mg once daily   Continue all other medications  Additional meal supplements: Ensure or Glucerna  Monitor blood pressure at home daily and keep a log (on your phone or piece of paper) to bring with you to your next visit. Write down date, time, blood pressure and pulse.  Please give [place] a call at [phone] with any questions or concerns.

## 2019-11-23 ENCOUNTER — Telehealth: Payer: Self-pay | Admitting: Pharmacist

## 2019-11-23 DIAGNOSIS — I10 Essential (primary) hypertension: Secondary | ICD-10-CM

## 2019-11-23 LAB — BASIC METABOLIC PANEL
BUN/Creatinine Ratio: 19 (ref 12–28)
BUN: 17 mg/dL (ref 8–27)
CO2: 26 mmol/L (ref 20–29)
Calcium: 9.4 mg/dL (ref 8.7–10.3)
Chloride: 102 mmol/L (ref 96–106)
Creatinine, Ser: 0.89 mg/dL (ref 0.57–1.00)
GFR calc Af Amer: 68 mL/min/{1.73_m2} (ref >59)
GFR calc non Af Amer: 59 mL/min/{1.73_m2} — ABNORMAL LOW (ref >59)
Glucose: 144 mg/dL — ABNORMAL HIGH (ref 65–99)
Potassium: 4.1 mmol/L (ref 3.5–5.2)
Sodium: 141 mmol/L (ref 134–144)

## 2019-11-23 NOTE — Telephone Encounter (Signed)
Contacted patient to inform her that potassium and renal function are normal. Okay to start Lisinopril 40 mg daily. Prescription sent to preferred pharmacy. Follow-up appointment and labs scheduled for September 14th at 2:30PM for HTN management. Patient aware she can complete labs before or after visit. Also recommended Ensure Max Protein or Glucerna Hunger Smart for meal supplementation.  Fabio Neighbors, PharmD PGY2 Ambulatory Care Pharmacy Resident [facility] [address], [place], [place] [address] Phone: [phone]; Fax: [phone]

## 2019-12-05 NOTE — Progress Notes (Signed)
Patient ID: Haley Sanders                 DOB: 12-03-1932                      MRN: [id number]     HPI: Haley Sanders is a 86 y.o. female referred by Dr. Reed Breech HTN clinic. PMH is significant for chronic diastolic heart failure, sleep apnea on CPAP, DM,HLD, hypertrophic obstructive cardiomyopathy (HOCM), and depression. Of note, Dr. Eldridge Dace would like to avoid vasodilators due to hx of HOCM. At last visit in HTN clinic on 11/22/19, lisinopril was increased to 40 mg daily.   Patient presents today in good spirits with her grand-daughter. Reports medication adherence with all medications. Reports "little" lightheadedness when sitting and standing - reports it goes away once she sits down and rest; states this occurs "three-fourths of the time during the week." Denies headaches, blurred vision, chest pain, and additional LE ankle swelling. Home BP readings after medications range from 130s-170s/70-80s with highs of 173/85 and 174/93 and HR 55-70s with a high of 81. Additionally, patient denies any history of atrial fibrillation or flutter.   Current HTN meds: Lisinopril 40 mg daily (PM) Clonidine 0.2 mg BID HCTZ 25 mg daily (AM)  Verapamil 60 mg BID  Previously tried:clonidine 0.1 mg, telmisartan/HCTZ 80/25  BP goal:<130/80  Family History:The patient's family history includes Alzheimer's disease in her sister; CAD in her son; Cancer in her mother; Heart attack in her father, maternal grandfather, and paternal grandmother; Stroke in her father.  Social History:Quit smoking in 1990  Diet:lives in retirement home, eats 1 meal in cafeteria. Usually vegetables, meat, starchy vegetable and desert.   Exercise:goes to exercise room. Tries to go 3 times a week.   Home BP readings: From 9/7 - 9/14 AM before meds: 170/85, 144/75, 156/79, 147/76, 149/76, 173/85, 156/80, 168/95, 177/88, 167/84, 155/80, 163/82, 158/81 PM after meds: 147/78, 131/71, 139/75, 157/76,  150/70, 166/82, 173/85, 153/79, 174/93, 156/74 HR: 55-70s with a high of 81  Wt Readings from Last 3 Encounters:  10/11/19 161 lb 6.4 oz (73.2 kg)  09/29/19 161 lb 6.4 oz (73.2 kg)  07/25/19 164 lb (74.4 kg)   BP Readings from Last 3 Encounters:  12/06/19 (!) 145/70  11/22/19 (!) 150/87  10/11/19 (!) 164/80   Pulse Readings from Last 3 Encounters:  12/06/19 67  11/22/19 64  10/11/19 (!) 57    Renal function: CrCl cannot be calculated (Unknown ideal weight.).  Past Medical History:  Diagnosis Date  . Depression   . Diabetes mellitus   . GERD (gastroesophageal reflux disease)   . Hypercholesterolemia   . Hypertension   . Hypothyroidism   . Memory deficit   . Osteoporosis     Current Outpatient Medications on File Prior to Visit  Medication Sig Dispense Refill  . albuterol (PROVENTIL HFA;VENTOLIN HFA) 108 (90 Base) MCG/ACT inhaler Inhale 2 puffs into the lungs every 6 (six) hours as needed for wheezing or shortness of breath.     Marland Kitchen aspirin 81 MG tablet Take 81 mg by mouth daily.     Marland Kitchen atorvastatin (LIPITOR) 40 MG tablet Take 40 mg by mouth daily.    . Azelastine HCl 0.15 % SOLN as needed.    Marland Kitchen buPROPion (ZYBAN) 150 MG 12 hr tablet Take 150 mg by mouth 2 (two) times daily.    . cloNIDine (CATAPRES) 0.2 MG tablet Take 0.2 mg by mouth 2 (two) times daily.    Marland Kitchen  donepezil (ARICEPT) 10 MG tablet Take 10 mg by mouth at bedtime.     Marland Kitchen esomeprazole (NEXIUM) 40 MG capsule Take 40 mg by mouth 2 (two) times daily before a meal.     . fish oil-omega-3 fatty acids 1000 MG capsule Take 1 g by mouth daily.     Marland Kitchen FLUoxetine (PROZAC) 40 MG capsule Take 40 mg by mouth daily.    . hydrochlorothiazide (HYDRODIURIL) 25 MG tablet Take 1 tablet (25 mg total) by mouth daily for 30 days. 30 tablet 0  . levocetirizine (XYZAL) 5 MG tablet Take 5 mg by mouth every evening.     Marland Kitchen levothyroxine (SYNTHROID, LEVOTHROID) 100 MCG tablet Take 100 mcg by mouth daily before breakfast.     . lisinopril  (ZESTRIL) 40 MG tablet Take 1 tablet (40 mg total) by mouth daily. 90 tablet 3  . metFORMIN (GLUCOPHAGE-XR) 500 MG 24 hr tablet Take 500 mg by mouth daily with breakfast.   3  . montelukast (SINGULAIR) 10 MG tablet Take 10 mg by mouth at bedtime.    . Mouthwashes (BIOTENE/CALCIUM PBF) LIQD Take one tablespoon by mouth (45ml) and rinse solution for 30 seconds then spit out (Patient taking differently: 15 mLs by Mouth Rinse route See admin instructions. Rinse daily for 30 seconds, using 15 ml's, then spit out- as directed) 450 mL 0  . ONE TOUCH ULTRA TEST test strip 1 each by Other route as needed for other.     . psyllium (REGULOID) 0.52 G capsule Take 0.52 g by mouth 2 (two) times daily.     . verapamil (CALAN) 120 MG tablet Take 60 mg by mouth 2 (two) times daily.     . vitamin E 600 UNIT capsule Take 600 Units by mouth daily.     No current facility-administered medications on file prior to visit.    Allergies  Allergen Reactions  . Codeine Other (See Comments)    FEELS  EXTREMELY "LOOPY"  . Grass Extracts [Gramineae Pollens] Itching and Other (See Comments)    Runny nose, itchy eyes, allergic symptoms  . Mold Extract [Trichophyton] Other (See Comments)    Takes allergy shots for this- sinus infections/congestion   Assessment/Plan:  1. Hypertension - Clinic BP above goal < 130/80 mmHg and home BP readings remain elevated. Patient reports adherence with current medications. Willplan to start Spironolactone 12.5mg  daily for 3 days, then increase to 25 mg daily if BMET drawn today is within normal limits. Instructed patient to check morning BP at least 1 hour after medications and to bring BP monitor to next visit. Will discontinue verapamil as it has minimal blood pressure effects and patient reports she does not have history of HR irregularities. Will plan to see patient in two weeks to check BP, HR, and BMET again to assesselectrolytesand kidney function after spironolactone initiation.    Fabio Neighbors, PharmD PGY2 Ambulatory Care Pharmacy Resident [facility] [address], [place], [place] [address] Phone: [phone]; Fax: [phone]

## 2019-12-06 ENCOUNTER — Other Ambulatory Visit: Payer: Self-pay

## 2019-12-06 ENCOUNTER — Other Ambulatory Visit: Payer: Medicare PPO

## 2019-12-06 ENCOUNTER — Ambulatory Visit (INDEPENDENT_AMBULATORY_CARE_PROVIDER_SITE_OTHER): Payer: Medicare PPO | Admitting: Pharmacist

## 2019-12-06 VITALS — BP 145/70 | HR 67

## 2019-12-06 DIAGNOSIS — I10 Essential (primary) hypertension: Secondary | ICD-10-CM

## 2019-12-06 DIAGNOSIS — I110 Hypertensive heart disease with heart failure: Secondary | ICD-10-CM | POA: Diagnosis not present

## 2019-12-06 NOTE — Patient Instructions (Signed)
It was nice to see you today!  Your goal blood pressure is <130/80 mmHg. In clinic, your blood pressure was 145/70 mmHg.  Medication Changes: Begin taking spironolactone 12.5 mg once daily in the mornings. We will give you a call when the medication is ready for pick-up.  STOP verapamil   Continue all other medications  Bring BP monitor to your next visit!  Monitor blood pressure at home daily and keep a log (on your phone or piece of paper) to bring with you to your next visit. Write down date, time, blood pressure and pulse.  Keep up the good work with diet and exercise. Aim for a diet full of vegetables, fruit and lean meats (chicken, [place], fish). Try to limit salt intake by eating fresh or frozen vegetables (instead of canned), rinse canned vegetables prior to cooking and do not add any additional salt to meals.   Please give [place] a call at [phone] with any questions or concerns.

## 2019-12-07 ENCOUNTER — Telehealth: Payer: Self-pay | Admitting: Pharmacist

## 2019-12-07 DIAGNOSIS — I10 Essential (primary) hypertension: Secondary | ICD-10-CM

## 2019-12-07 LAB — BASIC METABOLIC PANEL
BUN/Creatinine Ratio: 17 (ref 12–28)
BUN: 19 mg/dL (ref 8–27)
CO2: 27 mmol/L (ref 20–29)
Calcium: 9.7 mg/dL (ref 8.7–10.3)
Chloride: 101 mmol/L (ref 96–106)
Creatinine, Ser: 1.15 mg/dL — ABNORMAL HIGH (ref 0.57–1.00)
GFR calc Af Amer: 50 mL/min/{1.73_m2} — ABNORMAL LOW (ref >59)
GFR calc non Af Amer: 43 mL/min/{1.73_m2} — ABNORMAL LOW (ref >59)
Glucose: 136 mg/dL — ABNORMAL HIGH (ref 65–99)
Potassium: 4.3 mmol/L (ref 3.5–5.2)
Sodium: 140 mmol/L (ref 134–144)

## 2019-12-07 MED ORDER — AMLODIPINE BESYLATE 2.5 MG PO TABS: 2.5000 mg | ORAL_TABLET | Freq: Every day | ORAL | 11 refills | Status: DC

## 2019-12-07 MED ORDER — AMLODIPINE BESYLATE 2.5 MG PO TABS: 2.5000 mg | ORAL_TABLET | Freq: Every day | ORAL | 3 refills | Status: DC

## 2019-12-07 NOTE — Addendum Note (Signed)
Addended by: [place] Millon E on: 12/07/2019 01:30 PM   Modules accepted: Orders

## 2019-12-07 NOTE — Telephone Encounter (Signed)
Contacted and informed patient that renal function (Scr) increased s/p increasing lisinopril from 20 mg to 40 mg daily. Per our visit yesterday, original plan was to start spironolactone pending BMET, however given recent bump in Scr, we will not start spironolactone. Instead will start low-dose amlodipine 2.5 mg daily given history of LE ankle swelling. Instructed patient to contact [facility] if she experiences significant swelling. Patient verbalized understanding. Prescription sent to preferred pharmacy. Follow-up HTN appointment and BMET scheduled in 2-weeks on September 28th. Patient aware she can complete labs before or after visit.  Haley Sanders, PharmD PGY2 Ambulatory Care Resident [facility]

## 2019-12-15 DIAGNOSIS — H25812 Combined forms of age-related cataract, left eye: Secondary | ICD-10-CM | POA: Diagnosis not present

## 2019-12-15 DIAGNOSIS — E119 Type 2 diabetes mellitus without complications: Secondary | ICD-10-CM | POA: Diagnosis not present

## 2019-12-15 DIAGNOSIS — H5203 Hypermetropia, bilateral: Secondary | ICD-10-CM | POA: Diagnosis not present

## 2019-12-16 DIAGNOSIS — G609 Hereditary and idiopathic neuropathy, unspecified: Secondary | ICD-10-CM | POA: Diagnosis not present

## 2019-12-16 DIAGNOSIS — B351 Tinea unguium: Secondary | ICD-10-CM | POA: Diagnosis not present

## 2019-12-16 DIAGNOSIS — M2041 Other hammer toe(s) (acquired), right foot: Secondary | ICD-10-CM | POA: Diagnosis not present

## 2019-12-16 DIAGNOSIS — M79675 Pain in left toe(s): Secondary | ICD-10-CM | POA: Diagnosis not present

## 2019-12-19 NOTE — Progress Notes (Signed)
Patient ID: Haley Sanders                 DOB: 02/18/1933                      MRN: [id number]     HPI: Haley Sanders is a 86 y.o. female referred by Dr. Reed Breech HTN clinic. PMH is significant forchronic diastolic heart failure,sleep apneaon CPAP, DM,HLD, hypertrophic obstructive cardiomyopathy (HOCM), and depression. Of note, Dr. Eldridge Dace would like to avoid vasodilators due to hx of HOCM.   At last visit in HTN clinic on 12/06/19, Scr increased by ~29% after increasing lisinopril from 20 to 40 mg daily. Therefore, instead of starting spironolactone for additional BP management, patient was started on low-dose amlodipine 2.5 mg daily given history of LE ankle swelling. Verapamil was discontinued as it has minimal BP effects and patient denied history of atrial fibrillation, flutter, or HR irregularities.   Patient presents today for 2-week HTN follow-up. Reports medication adherence with all HTN medications. Reports lightheadedness is the same as last time - occurs when bending over and resolves once standing and/or sitting down. Denies dizziness, headaches, blurred vision, palpitations, SOB and LE swelling. Additionally, patient brought home BP monitor. Home readings in the morning range from 120s-150s/60s-80s with one high of 160/82 and evening readings range from 140s-150s/70-80s; HR: 54-80s (one low of 49 and high of 81). BP this morning prior to visit was 129/70.  Current HTN meds: Amlodipine 2.5 mg daily (AM) Lisinopril 40 mg daily (PM) Clonidine 0.2 mgBID HCTZ 25 mg daily (AM)   Previously tried:clonidine 0.1 mg, telmisartan/HCTZ 80/25  BP goal:<130/80  Family History:The patient's family history includes Alzheimer's disease in her sister; CAD in her son; Cancer in her mother; Heart attack in her father, maternal grandfather, and paternal grandmother; Stroke in her father.  Social History:Quit smoking in 1990  Diet:lives in retirement home, eats 1  meal in cafeteria. Usually vegetables, meat, starchy vegetable and desert.   Exercise:goes to exercise room. Tries to go 3 times a week.  Home BP readings: 9/14-9/28 AM: 158/81, 127/73, 121/64, 115/71, 120/75, 137/72, 155/79, 131/71, 143/73, 130/73, 144/76, 160/82, 124/63, 129/70 PM: 149/78, 149/70, 155/82, 136/67, 153/76, 157/80, 156/74, 148/75, 135/73, 147/74, 151/79, 137/78 HR: 54-80s (one low of 49 and high of 81)  Wt Readings from Last 3 Encounters:  10/11/19 161 lb 6.4 oz (73.2 kg)  09/29/19 161 lb 6.4 oz (73.2 kg)  07/25/19 164 lb (74.4 kg)   BP Readings from Last 3 Encounters:  12/06/19 (!) 145/70  11/22/19 (!) 150/87  10/11/19 (!) 164/80   Pulse Readings from Last 3 Encounters:  12/06/19 67  11/22/19 64  10/11/19 (!) 57    Renal function: CrCl cannot be calculated (Unknown ideal weight.).  Past Medical History:  Diagnosis Date  . Depression   . Diabetes mellitus   . GERD (gastroesophageal reflux disease)   . Hypercholesterolemia   . Hypertension   . Hypothyroidism   . Memory deficit   . Osteoporosis     Current Outpatient Medications on File Prior to Visit  Medication Sig Dispense Refill  . albuterol (PROVENTIL HFA;VENTOLIN HFA) 108 (90 Base) MCG/ACT inhaler Inhale 2 puffs into the lungs every 6 (six) hours as needed for wheezing or shortness of breath.     Marland Kitchen amLODipine (NORVASC) 2.5 MG tablet Take 1 tablet (2.5 mg total) by mouth daily. 30 tablet 11  . aspirin 81 MG tablet Take 81 mg by mouth daily.     Marland Kitchen  atorvastatin (LIPITOR) 40 MG tablet Take 40 mg by mouth daily.    . Azelastine HCl 0.15 % SOLN as needed.    Marland Kitchen buPROPion (ZYBAN) 150 MG 12 hr tablet Take 150 mg by mouth 2 (two) times daily.    . cloNIDine (CATAPRES) 0.2 MG tablet Take 0.2 mg by mouth 2 (two) times daily.    Marland Kitchen donepezil (ARICEPT) 10 MG tablet Take 10 mg by mouth at bedtime.     Marland Kitchen esomeprazole (NEXIUM) 40 MG capsule Take 40 mg by mouth 2 (two) times daily before a meal.     . fish  oil-omega-3 fatty acids 1000 MG capsule Take 1 g by mouth daily.     Marland Kitchen FLUoxetine (PROZAC) 40 MG capsule Take 40 mg by mouth daily.    . hydrochlorothiazide (HYDRODIURIL) 25 MG tablet Take 1 tablet (25 mg total) by mouth daily for 30 days. 30 tablet 0  . levocetirizine (XYZAL) 5 MG tablet Take 5 mg by mouth every evening.     Marland Kitchen levothyroxine (SYNTHROID, LEVOTHROID) 100 MCG tablet Take 100 mcg by mouth daily before breakfast.     . lisinopril (ZESTRIL) 40 MG tablet Take 1 tablet (40 mg total) by mouth daily. 90 tablet 3  . metFORMIN (GLUCOPHAGE-XR) 500 MG 24 hr tablet Take 500 mg by mouth daily with breakfast.   3  . montelukast (SINGULAIR) 10 MG tablet Take 10 mg by mouth at bedtime.    . Mouthwashes (BIOTENE/CALCIUM PBF) LIQD Take one tablespoon by mouth (55ml) and rinse solution for 30 seconds then spit out (Patient taking differently: 15 mLs by Mouth Rinse route See admin instructions. Rinse daily for 30 seconds, using 15 ml's, then spit out- as directed) 450 mL 0  . ONE TOUCH ULTRA TEST test strip 1 each by Other route as needed for other.     . psyllium (REGULOID) 0.52 G capsule Take 0.52 g by mouth 2 (two) times daily.     . vitamin E 600 UNIT capsule Take 600 Units by mouth daily.     No current facility-administered medications on file prior to visit.    Allergies  Allergen Reactions  . Codeine Other (See Comments)    FEELS  EXTREMELY "LOOPY"  . Grass Extracts [Gramineae Pollens] Itching and Other (See Comments)    Runny nose, itchy eyes, allergic symptoms  . Mold Extract [Trichophyton] Other (See Comments)    Takes allergy shots for this- sinus infections/congestion    Assessment/Plan:  1. Hypertension - BP above goal < 130/80 mmHg. Patient brought home BP monitor and reading of 165/88 is consistent with clinic BP of 162/80 mmHg. Home readings have improved since last visit, however readings are still above goal. Additionally, it appears BP is higher in the afternoon/evening  compared to mornings. Medication adherence appears optimal as patient takes amlodipine and HCTZ in the mornings, lisinopril in the evenings, and clonidine twice daily. Will increase amlodipine to 5 mg daily. Continue Lisinopril 40 mg daily (PM), Clonidine 0.2 mgBID and HCTZ 25 mg daily (AM). Will plan to follow-up with patient in 1 month.  Fabio Neighbors, PharmD PGY2 Ambulatory Care Pharmacy Resident [facility] [address], [place], [place] [address] Phone: [phone]; Fax: [phone]

## 2019-12-20 ENCOUNTER — Other Ambulatory Visit: Payer: Medicare PPO

## 2019-12-20 ENCOUNTER — Other Ambulatory Visit: Payer: Self-pay

## 2019-12-20 ENCOUNTER — Ambulatory Visit (INDEPENDENT_AMBULATORY_CARE_PROVIDER_SITE_OTHER): Payer: Medicare PPO | Admitting: Pharmacist

## 2019-12-20 VITALS — BP 162/80 | HR 67

## 2019-12-20 DIAGNOSIS — I10 Essential (primary) hypertension: Secondary | ICD-10-CM

## 2019-12-20 MED ORDER — AMLODIPINE BESYLATE 5 MG PO TABS: 5.0000 mg | ORAL_TABLET | Freq: Every day | ORAL | 11 refills | Status: DC

## 2019-12-20 NOTE — Patient Instructions (Signed)
It was nice to see you today!  Your goal blood pressure is <130/80 mmHg. In clinic, your blood pressure was 162/80 mmHg.  Medication Changes: Begin taking Amlodipine 5 mg daily  Continue all other medications  Monitor blood pressure at home daily and keep a log (on your phone or piece of paper) to bring with you to your next visit. Write down date, time, blood pressure and pulse.  Keep up the good work with diet and exercise. Aim for a diet full of vegetables, fruit and lean meats (chicken, [place], fish). Try to limit salt intake by eating fresh or frozen vegetables (instead of canned), rinse canned vegetables prior to cooking and do not add any additional salt to meals.   Please give [place] a call at [phone] with any questions or concerns.  See you in 1 month!

## 2019-12-21 LAB — BASIC METABOLIC PANEL
BUN/Creatinine Ratio: 16 (ref 12–28)
BUN: 16 mg/dL (ref 8–27)
CO2: 25 mmol/L (ref 20–29)
Calcium: 9.9 mg/dL (ref 8.7–10.3)
Chloride: 98 mmol/L (ref 96–106)
Creatinine, Ser: 0.98 mg/dL (ref 0.57–1.00)
GFR calc Af Amer: 61 mL/min/{1.73_m2} (ref >59)
GFR calc non Af Amer: 53 mL/min/{1.73_m2} — ABNORMAL LOW (ref >59)
Glucose: 148 mg/dL — ABNORMAL HIGH (ref 65–99)
Potassium: 3.8 mmol/L (ref 3.5–5.2)
Sodium: 139 mmol/L (ref 134–144)

## 2019-12-22 ENCOUNTER — Telehealth: Payer: Self-pay | Admitting: Pharmacist

## 2019-12-22 DIAGNOSIS — I10 Essential (primary) hypertension: Secondary | ICD-10-CM

## 2019-12-22 NOTE — Telephone Encounter (Signed)
Contacted patient to inform her about stable renal function, however she did not answer. Left HIPAA compliant voicemail to call clinical pharmacist when available.  Fabio Neighbors, PharmD PGY2 Ambulatory Care Pharmacy Resident [facility] [address] [place], [place] [address] Phone: [phone]; Fax: [phone]

## 2020-01-02 DIAGNOSIS — M859 Disorder of bone density and structure, unspecified: Secondary | ICD-10-CM | POA: Diagnosis not present

## 2020-01-02 DIAGNOSIS — E039 Hypothyroidism, unspecified: Secondary | ICD-10-CM | POA: Diagnosis not present

## 2020-01-02 DIAGNOSIS — E1140 Type 2 diabetes mellitus with diabetic neuropathy, unspecified: Secondary | ICD-10-CM | POA: Diagnosis not present

## 2020-01-02 DIAGNOSIS — E785 Hyperlipidemia, unspecified: Secondary | ICD-10-CM | POA: Diagnosis not present

## 2020-01-03 DIAGNOSIS — G4733 Obstructive sleep apnea (adult) (pediatric): Secondary | ICD-10-CM | POA: Diagnosis not present

## 2020-01-03 DIAGNOSIS — J449 Chronic obstructive pulmonary disease, unspecified: Secondary | ICD-10-CM | POA: Diagnosis not present

## 2020-01-09 DIAGNOSIS — E039 Hypothyroidism, unspecified: Secondary | ICD-10-CM | POA: Diagnosis not present

## 2020-01-09 DIAGNOSIS — I129 Hypertensive chronic kidney disease with stage 1 through stage 4 chronic kidney disease, or unspecified chronic kidney disease: Secondary | ICD-10-CM | POA: Diagnosis not present

## 2020-01-09 DIAGNOSIS — I10 Essential (primary) hypertension: Secondary | ICD-10-CM | POA: Diagnosis not present

## 2020-01-09 DIAGNOSIS — Z0000 Encounter for general adult medical examination without abnormal findings: Secondary | ICD-10-CM | POA: Diagnosis not present

## 2020-01-09 DIAGNOSIS — N1831 Chronic kidney disease, stage 3a: Secondary | ICD-10-CM | POA: Diagnosis not present

## 2020-01-09 DIAGNOSIS — E1140 Type 2 diabetes mellitus with diabetic neuropathy, unspecified: Secondary | ICD-10-CM | POA: Diagnosis not present

## 2020-01-09 DIAGNOSIS — Z23 Encounter for immunization: Secondary | ICD-10-CM | POA: Diagnosis not present

## 2020-01-09 DIAGNOSIS — F39 Unspecified mood [affective] disorder: Secondary | ICD-10-CM | POA: Diagnosis not present

## 2020-01-09 DIAGNOSIS — E669 Obesity, unspecified: Secondary | ICD-10-CM | POA: Diagnosis not present

## 2020-01-09 DIAGNOSIS — R82998 Other abnormal findings in urine: Secondary | ICD-10-CM | POA: Diagnosis not present

## 2020-01-09 DIAGNOSIS — F0390 Unspecified dementia without behavioral disturbance: Secondary | ICD-10-CM | POA: Diagnosis not present

## 2020-01-16 NOTE — Progress Notes (Signed)
Patient ID: Haley Sanders                 DOB: 10-28-1932                      MRN: [id number]     HPI: Haley Sanders is a 86 y.o. female referred by Dr. Reed Breech HTN clinic. PMH is significant forchronic diastolic heart failure,sleep apneaon CPAP, DM, HLD, hypertrophicobstructivecardiomyopathy(HOCM),and depression. Of note, Dr. Eldridge Dace would like to avoid vasodilators due to hx of HOCM.    Last seen in HTN clinic on 12/20/19, BP was elevated at 162/80 and amlodipine was increased to 5 mg daily.  Patient arrives ambulating with a cane for her 40-month HTN follow-up visit. Reports medication adherence with all hypertension medications. Denies dizziness, lightheadedness, headaches, blurred vision, and LE swelling. In the last two weeks (10/14-10/25) BP readings range from 120s-130s with a low of 101/60 and two outliers of 147/76 and 148/75.   Current HTN meds:   Amlodipine 5 mg daily (AM)  Lisinopril40mg  daily(PM)  Clonidine 0.2 mgBID  HCTZ 25 mg daily(AM)   Previously tried: telmisartan/HCTZ 80/25  BP goal:<130/29mmHg  Family History:The patient's family history includes Alzheimer's disease in her sister; CAD in her son; Cancer in her mother; Heart attack in her father, maternal grandfather, and paternal grandmother; Stroke in her father.  Social History:Quit smoking in 1990  Diet:lives in retirement home, eats 1 meal in cafeteria. Usually vegetables, meat, starchy vegetable and desert.   Exercise:goes to exercise room. Tries to go 3 times a week.  Home BP readings:  9/29 - 10/13:  -AM: 156/80, 159/83, 124/72, 135/73, 128/69, 125/66, 116/67, 144/69, 112/64, 119/66, 1332/70, 131/73, 138/71 - PM: 141/73, 165/67, 139/73, 165/77, 145/72, 139/70, 128/63, 118/62, 138/67, 121/66, 117/59, 162/80, 131/70, 146/71, 147/70  10/14 - 10/25: Most Recent -AM: 147/76, 132/71, 127/69, 125/61, 127/70, 130/69, 137/72, 104/60, 123/68, 128/74, 123/66 -PM:  133/68, 132/68, 134/71, 121/59, 110/59, 115/64, 118/60, 101/60, 148/75   HR: 55-60s  Wt Readings from Last 3 Encounters:  10/11/19 161 lb 6.4 oz (73.2 kg)  09/29/19 161 lb 6.4 oz (73.2 kg)  07/25/19 164 lb (74.4 kg)   BP Readings from Last 3 Encounters:  01/17/20 134/80  12/20/19 (!) 162/80  12/06/19 (!) 145/70   Pulse Readings from Last 3 Encounters:  01/17/20 64  12/20/19 67  12/06/19 67    Renal function: CrCl cannot be calculated (Patient's most recent lab result is older than the maximum 21 days allowed.).  Past Medical History:  Diagnosis Date  . Depression   . Diabetes mellitus   . GERD (gastroesophageal reflux disease)   . Hypercholesterolemia   . Hypertension   . Hypothyroidism   . Memory deficit   . Osteoporosis     Current Outpatient Medications on File Prior to Visit  Medication Sig Dispense Refill  . albuterol (PROVENTIL HFA;VENTOLIN HFA) 108 (90 Base) MCG/ACT inhaler Inhale 2 puffs into the lungs every 6 (six) hours as needed for wheezing or shortness of breath.     Marland Kitchen amLODipine (NORVASC) 5 MG tablet Take 1 tablet (5 mg total) by mouth daily. 30 tablet 11  . aspirin 81 MG tablet Take 81 mg by mouth daily.     Marland Kitchen atorvastatin (LIPITOR) 40 MG tablet Take 40 mg by mouth daily.    . Azelastine HCl 0.15 % SOLN as needed.    Marland Kitchen buPROPion (ZYBAN) 150 MG 12 hr tablet Take 150 mg by mouth 2 (two) times daily.    Marland Kitchen  cloNIDine (CATAPRES) 0.2 MG tablet Take 0.2 mg by mouth 2 (two) times daily.    Marland Kitchen donepezil (ARICEPT) 10 MG tablet Take 10 mg by mouth at bedtime.     Marland Kitchen esomeprazole (NEXIUM) 40 MG capsule Take 40 mg by mouth 2 (two) times daily before a meal.     . fish oil-omega-3 fatty acids 1000 MG capsule Take 1 g by mouth daily.     Marland Kitchen FLUoxetine (PROZAC) 40 MG capsule Take 40 mg by mouth daily.    . hydrochlorothiazide (HYDRODIURIL) 25 MG tablet Take 1 tablet (25 mg total) by mouth daily for 30 days. 30 tablet 0  . levocetirizine (XYZAL) 5 MG tablet Take 5 mg by  mouth every evening.     Marland Kitchen levothyroxine (SYNTHROID, LEVOTHROID) 100 MCG tablet Take 100 mcg by mouth daily before breakfast.     . lisinopril (ZESTRIL) 40 MG tablet Take 1 tablet (40 mg total) by mouth daily. 90 tablet 3  . metFORMIN (GLUCOPHAGE-XR) 500 MG 24 hr tablet Take 500 mg by mouth daily with breakfast.   3  . montelukast (SINGULAIR) 10 MG tablet Take 10 mg by mouth at bedtime.    . Mouthwashes (BIOTENE/CALCIUM PBF) LIQD Take one tablespoon by mouth (62ml) and rinse solution for 30 seconds then spit out (Patient taking differently: 15 mLs by Mouth Rinse route See admin instructions. Rinse daily for 30 seconds, using 15 ml's, then spit out- as directed) 450 mL 0  . ONE TOUCH ULTRA TEST test strip 1 each by Other route as needed for other.     . psyllium (REGULOID) 0.52 G capsule Take 0.52 g by mouth 2 (two) times daily.     . vitamin E 600 UNIT capsule Take 600 Units by mouth daily.     No current facility-administered medications on file prior to visit.    Allergies  Allergen Reactions  . Codeine Other (See Comments)    FEELS  EXTREMELY "LOOPY"  . Grass Extracts [Gramineae Pollens] Itching and Other (See Comments)    Runny nose, itchy eyes, allergic symptoms  . Mold Extract [Trichophyton] Other (See Comments)    Takes allergy shots for this- sinus infections/congestion     Assessment/Plan:  1. Hypertension - BP near goal <130/80 mmHg. Medication adherence appears optimal and home BP readings in the last two weeks have improved averaging SBP 120-130s. Will make no medication changes today and continue amlodipine 5 mg daily, lisinopril40mg  daily, clonidine 0.2 mgBID and HCTZ 25 mg daily. Instructed patient to call pharmacy clinic if she has any medication issues or sees BP readings consistently >140/90. Patient verbalized understanding and will f/u as needed in HTN clinic.  Fabio Neighbors, PharmD PGY2 Ambulatory Care Pharmacy Resident [facility] [address] [place], [place] [address] Phone: [phone]; Fax: [phone]

## 2020-01-17 ENCOUNTER — Other Ambulatory Visit: Payer: Self-pay

## 2020-01-17 ENCOUNTER — Encounter: Payer: Self-pay | Admitting: Pharmacist

## 2020-01-17 ENCOUNTER — Ambulatory Visit (INDEPENDENT_AMBULATORY_CARE_PROVIDER_SITE_OTHER): Payer: Medicare PPO | Admitting: Pharmacist

## 2020-01-17 VITALS — BP 134/80 | HR 64

## 2020-01-17 DIAGNOSIS — I10 Essential (primary) hypertension: Secondary | ICD-10-CM | POA: Diagnosis not present

## 2020-01-17 NOTE — Patient Instructions (Signed)
It was nice to see you today!  Your goal blood pressure is 140/90 mmHg. In clinic, your blood pressure was 134/80 mmHg.  Medication Changes:  Continue  Amlodipine 5 mg daily   Lisinopril40mg  daily  Clonidine 0.2 mgtwice daily  HCTZ 25 mg daily  Monitor blood pressure at home daily and keep a log (on your phone or piece of paper) to bring with you to your next visit. Write down date, time, blood pressure and pulse.  Keep up the good work with diet and exercise. Aim for a diet full of vegetables, fruit and lean meats (chicken, [place], fish). Try to limit salt intake by eating fresh or frozen vegetables (instead of canned), rinse canned vegetables prior to cooking and do not add any additional salt to meals.   Please give [place] a call at [phone] with any questions or concerns.

## 2020-02-10 ENCOUNTER — Other Ambulatory Visit (HOSPITAL_COMMUNITY): Payer: Self-pay | Admitting: Internal Medicine

## 2020-02-10 ENCOUNTER — Other Ambulatory Visit: Payer: Self-pay | Admitting: Internal Medicine

## 2020-02-10 DIAGNOSIS — R110 Nausea: Secondary | ICD-10-CM

## 2020-02-10 DIAGNOSIS — K219 Gastro-esophageal reflux disease without esophagitis: Secondary | ICD-10-CM | POA: Diagnosis not present

## 2020-02-10 DIAGNOSIS — R112 Nausea with vomiting, unspecified: Secondary | ICD-10-CM | POA: Diagnosis not present

## 2020-02-10 DIAGNOSIS — R1011 Right upper quadrant pain: Secondary | ICD-10-CM | POA: Diagnosis not present

## 2020-02-10 DIAGNOSIS — I129 Hypertensive chronic kidney disease with stage 1 through stage 4 chronic kidney disease, or unspecified chronic kidney disease: Secondary | ICD-10-CM | POA: Diagnosis not present

## 2020-02-10 DIAGNOSIS — N1831 Chronic kidney disease, stage 3a: Secondary | ICD-10-CM | POA: Diagnosis not present

## 2020-02-10 DIAGNOSIS — K5900 Constipation, unspecified: Secondary | ICD-10-CM | POA: Diagnosis not present

## 2020-02-14 ENCOUNTER — Ambulatory Visit (HOSPITAL_COMMUNITY)
Admission: RE | Admit: 2020-02-14 | Discharge: 2020-02-14 | Disposition: A | Discharge status: Home / Self Care | Payer: Medicare PPO | Source: Ambulatory Visit | Attending: Internal Medicine | Admitting: Internal Medicine

## 2020-02-14 ENCOUNTER — Other Ambulatory Visit: Payer: Self-pay

## 2020-02-14 DIAGNOSIS — R1011 Right upper quadrant pain: Secondary | ICD-10-CM | POA: Diagnosis not present

## 2020-02-14 DIAGNOSIS — R110 Nausea: Secondary | ICD-10-CM | POA: Diagnosis not present

## 2020-02-28 ENCOUNTER — Other Ambulatory Visit: Payer: Self-pay | Admitting: Physician Assistant

## 2020-02-28 DIAGNOSIS — K760 Fatty (change of) liver, not elsewhere classified: Secondary | ICD-10-CM | POA: Diagnosis not present

## 2020-02-28 DIAGNOSIS — R1013 Epigastric pain: Secondary | ICD-10-CM

## 2020-02-28 DIAGNOSIS — K5900 Constipation, unspecified: Secondary | ICD-10-CM | POA: Diagnosis not present

## 2020-02-28 DIAGNOSIS — R1310 Dysphagia, unspecified: Secondary | ICD-10-CM | POA: Diagnosis not present

## 2020-03-01 ENCOUNTER — Other Ambulatory Visit (HOSPITAL_COMMUNITY): Payer: Self-pay

## 2020-03-01 DIAGNOSIS — R1310 Dysphagia, unspecified: Secondary | ICD-10-CM

## 2020-03-08 DIAGNOSIS — E1140 Type 2 diabetes mellitus with diabetic neuropathy, unspecified: Secondary | ICD-10-CM | POA: Diagnosis not present

## 2020-03-08 DIAGNOSIS — Z1212 Encounter for screening for malignant neoplasm of rectum: Secondary | ICD-10-CM | POA: Diagnosis not present

## 2020-03-08 DIAGNOSIS — E039 Hypothyroidism, unspecified: Secondary | ICD-10-CM | POA: Diagnosis not present

## 2020-03-08 DIAGNOSIS — I129 Hypertensive chronic kidney disease with stage 1 through stage 4 chronic kidney disease, or unspecified chronic kidney disease: Secondary | ICD-10-CM | POA: Diagnosis not present

## 2020-03-08 DIAGNOSIS — K5900 Constipation, unspecified: Secondary | ICD-10-CM | POA: Diagnosis not present

## 2020-03-08 DIAGNOSIS — F0390 Unspecified dementia without behavioral disturbance: Secondary | ICD-10-CM | POA: Diagnosis not present

## 2020-03-08 DIAGNOSIS — N1831 Chronic kidney disease, stage 3a: Secondary | ICD-10-CM | POA: Diagnosis not present

## 2020-03-09 ENCOUNTER — Other Ambulatory Visit: Payer: Self-pay

## 2020-03-09 ENCOUNTER — Ambulatory Visit (HOSPITAL_COMMUNITY)
Admission: RE | Admit: 2020-03-09 | Discharge: 2020-03-09 | Disposition: A | Discharge status: Home / Self Care | Payer: Medicare PPO | Source: Ambulatory Visit | Attending: Physician Assistant | Admitting: Physician Assistant

## 2020-03-09 ENCOUNTER — Other Ambulatory Visit (HOSPITAL_COMMUNITY): Payer: Self-pay

## 2020-03-09 DIAGNOSIS — R1310 Dysphagia, unspecified: Secondary | ICD-10-CM

## 2020-03-09 DIAGNOSIS — R059 Cough, unspecified: Secondary | ICD-10-CM | POA: Diagnosis not present

## 2020-03-13 DIAGNOSIS — J301 Allergic rhinitis due to pollen: Secondary | ICD-10-CM | POA: Diagnosis not present

## 2020-03-13 DIAGNOSIS — J3089 Other allergic rhinitis: Secondary | ICD-10-CM | POA: Diagnosis not present

## 2020-03-13 DIAGNOSIS — H1045 Other chronic allergic conjunctivitis: Secondary | ICD-10-CM | POA: Diagnosis not present

## 2020-03-13 DIAGNOSIS — J4530 Mild persistent asthma, uncomplicated: Secondary | ICD-10-CM | POA: Diagnosis not present

## 2020-03-19 ENCOUNTER — Other Ambulatory Visit: Payer: Self-pay | Admitting: Physician Assistant

## 2020-03-19 ENCOUNTER — Ambulatory Visit
Admission: RE | Admit: 2020-03-19 | Discharge: 2020-03-19 | Disposition: A | Discharge status: Home / Self Care | Payer: Medicare PPO | Source: Ambulatory Visit | Attending: Physician Assistant | Admitting: Physician Assistant

## 2020-03-19 DIAGNOSIS — N281 Cyst of kidney, acquired: Secondary | ICD-10-CM | POA: Diagnosis not present

## 2020-03-19 DIAGNOSIS — J439 Emphysema, unspecified: Secondary | ICD-10-CM | POA: Diagnosis not present

## 2020-03-19 DIAGNOSIS — I700 Atherosclerosis of aorta: Secondary | ICD-10-CM | POA: Diagnosis not present

## 2020-03-19 DIAGNOSIS — Z90710 Acquired absence of both cervix and uterus: Secondary | ICD-10-CM | POA: Diagnosis not present

## 2020-03-19 DIAGNOSIS — K5900 Constipation, unspecified: Secondary | ICD-10-CM | POA: Diagnosis not present

## 2020-03-19 DIAGNOSIS — R1013 Epigastric pain: Secondary | ICD-10-CM

## 2020-03-19 DIAGNOSIS — K5909 Other constipation: Secondary | ICD-10-CM

## 2020-03-19 MED ORDER — IOPAMIDOL (ISOVUE-300) INJECTION 61%
100.0000 mL | Freq: Once | INTRAVENOUS | Status: AC | PRN
  Administered 2020-03-19: 100 mL via INTRAVENOUS

## 2020-03-24 DIAGNOSIS — Z20822 Contact with and (suspected) exposure to covid-19: Secondary | ICD-10-CM | POA: Diagnosis not present

## 2020-03-29 ENCOUNTER — Other Ambulatory Visit: Payer: Self-pay | Admitting: Physician Assistant

## 2020-03-29 ENCOUNTER — Other Ambulatory Visit: Payer: Self-pay | Admitting: Gastroenterology

## 2020-03-29 ENCOUNTER — Other Ambulatory Visit (HOSPITAL_COMMUNITY): Payer: Self-pay | Admitting: Physician Assistant

## 2020-03-29 DIAGNOSIS — R634 Abnormal weight loss: Secondary | ICD-10-CM | POA: Diagnosis not present

## 2020-03-29 DIAGNOSIS — K219 Gastro-esophageal reflux disease without esophagitis: Secondary | ICD-10-CM

## 2020-03-29 DIAGNOSIS — R059 Cough, unspecified: Secondary | ICD-10-CM | POA: Diagnosis not present

## 2020-03-29 DIAGNOSIS — K5900 Constipation, unspecified: Secondary | ICD-10-CM | POA: Diagnosis not present

## 2020-04-02 ENCOUNTER — Telehealth: Payer: Self-pay | Admitting: Pharmacist

## 2020-04-02 MED ORDER — VALSARTAN 160 MG PO TABS
160.0000 mg | ORAL_TABLET | Freq: Every day | ORAL | 3 refills | Status: DC
Start: 2020-04-02 — End: 2020-05-01

## 2020-04-02 NOTE — Telephone Encounter (Signed)
Patient called stating GI thinks her lisinopril is causing her cough. Will stop lisinopril and start valsartan 160mg . Pt scheduled for follow up in the HTN clinic in 2 weeks.

## 2020-04-05 NOTE — Telephone Encounter (Signed)
Patient called stating that her valsartan was going to be $160. Looking at her formulary, it is a tier one. I called her insurance. Looks like they denied it due to possible duplicate therapy (was on ACE which we are switching to ARB). Pharmacy must have quoted her a cash price. Spoke with insurance to have them override the rejection. Clinical questions answered and now approved. Called pharmacy and confirmed they could fill. Cost $10 Called and left VM for pt to call back to let her know issue has been resolved.

## 2020-04-06 DIAGNOSIS — G4733 Obstructive sleep apnea (adult) (pediatric): Secondary | ICD-10-CM | POA: Diagnosis not present

## 2020-04-06 DIAGNOSIS — J449 Chronic obstructive pulmonary disease, unspecified: Secondary | ICD-10-CM | POA: Diagnosis not present

## 2020-04-12 ENCOUNTER — Ambulatory Visit (HOSPITAL_COMMUNITY): Payer: Medicare PPO

## 2020-04-12 ENCOUNTER — Encounter (HOSPITAL_COMMUNITY): Payer: Self-pay

## 2020-04-19 ENCOUNTER — Ambulatory Visit: Payer: Medicare PPO

## 2020-04-19 NOTE — Progress Notes (Deleted)
Patient ID: Haley Sanders                 DOB: 01-30-1933                      MRN: [id number]     HPI: Haley Sanders is a 87 y.o. female referred by Dr. Reed Bree[facility] HTN clinic. PMH is significant forchronic diastolic heart failure,sleep apneaon CPAP, DM, HLD, hypertrophicobstructivecardiomyopathy(HOCM),and depression. Of note, Dr. Eldridge Dace would like to avoid vasodilators due to hx of HOCM.    Last seen in HTN clinic on 01/17/20, BP was very close to goal. She was to follow up as needed. Patient called clinic on 1/10 stating she has had a cough for a while and GI thinks its from her lisinopril. We switched her to valsartan to see if this would help.  Home BP? Adherence? Cough better? Dizziness, lightheadedness, headache, blurred vision, SOB, swelling Still exercising? BMP?  Current HTN meds:   Amlodipine 5 mg daily (AM)  Valsartan 160mg  daily  Clonidine 0.2 mgBID  HCTZ 25 mg daily(AM)   Previously tried: telmisartan/HCTZ 80/25, lisinopril (cough)  BP goal:<130/73mmHg  Family History:The patient's family history includes Alzheimer's disease in her sister; CAD in her son; Cancer in her mother; Heart attack in her father, maternal grandfather, and paternal grandmother; Stroke in her father.  Social History:Quit smoking in 1990  Diet:lives in retirement home, eats 1 meal in cafeteria. Usually vegetables, meat, starchy vegetable and desert.   Exercise:goes to exercise room. Tries to go 3 times a week.  Home BP readings:  9/29 - 10/13:  -AM: 156/80, 159/83, 124/72, 135/73, 128/69, 125/66, 116/67, 144/69, 112/64, 119/66, 1332/70, 131/73, 138/71 - PM: 141/73, 165/67, 139/73, 165/77, 145/72, 139/70, 128/63, 118/62, 138/67, 121/66, 117/59, 162/80, 131/70, 146/71, 147/70  10/14 - 10/25: Most Recent -AM: 147/76, 132/71, 127/69, 125/61, 127/70, 130/69, 137/72, 104/60, 123/68, 128/74, 123/66 -PM: 133/68, 132/68, 134/71, 121/59, 110/59, 115/64,  118/60, 101/60, 148/75   HR: 55-60s  Wt Readings from Last 3 Encounters:  10/11/19 161 lb 6.4 oz (73.2 kg)  09/29/19 161 lb 6.4 oz (73.2 kg)  07/25/19 164 lb (74.4 kg)   BP Readings from Last 3 Encounters:  01/17/20 134/80  12/20/19 (!) 162/80  12/06/19 (!) 145/70   Pulse Readings from Last 3 Encounters:  01/17/20 64  12/20/19 67  12/06/19 67    Renal function: CrCl cannot be calculated (Patient's most recent lab result is older than the maximum 21 days allowed.).  Past Medical History:  Diagnosis Date  . Depression   . Diabetes mellitus   . GERD (gastroesophageal reflux disease)   . Hypercholesterolemia   . Hypertension   . Hypothyroidism   . Memory deficit   . Osteoporosis     Current Outpatient Medications on File Prior to Visit  Medication Sig Dispense Refill  . albuterol (PROVENTIL HFA;VENTOLIN HFA) 108 (90 Base) MCG/ACT inhaler Inhale 2 puffs into the lungs every 6 (six) hours as needed for wheezing or shortness of breath.     12/08/19 amLODipine (NORVASC) 5 MG tablet Take 1 tablet (5 mg total) by mouth daily. 30 tablet 11  . aspirin 81 MG tablet Take 81 mg by mouth daily.     Marland Kitchen atorvastatin (LIPITOR) 40 MG tablet Take 40 mg by mouth daily.    . Azelastine HCl 0.15 % SOLN as needed.    Marland Kitchen buPROPion (ZYBAN) 150 MG 12 hr tablet Take 150 mg by mouth 2 (two) times daily.    Marland Kitchen  cloNIDine (CATAPRES) 0.2 MG tablet Take 0.2 mg by mouth 2 (two) times daily.    Marland Kitchen donepezil (ARICEPT) 10 MG tablet Take 10 mg by mouth at bedtime.     Marland Kitchen esomeprazole (NEXIUM) 40 MG capsule Take 40 mg by mouth 2 (two) times daily before a meal.     . fish oil-omega-3 fatty acids 1000 MG capsule Take 1 g by mouth daily.     Marland Kitchen FLUoxetine (PROZAC) 40 MG capsule Take 40 mg by mouth daily.    . hydrochlorothiazide (HYDRODIURIL) 25 MG tablet Take 1 tablet (25 mg total) by mouth daily for 30 days. 30 tablet 0  . levocetirizine (XYZAL) 5 MG tablet Take 5 mg by mouth every evening.     Marland Kitchen levothyroxine  (SYNTHROID, LEVOTHROID) 100 MCG tablet Take 100 mcg by mouth daily before breakfast.     . metFORMIN (GLUCOPHAGE-XR) 500 MG 24 hr tablet Take 500 mg by mouth daily with breakfast.   3  . montelukast (SINGULAIR) 10 MG tablet Take 10 mg by mouth at bedtime.    . Mouthwashes (BIOTENE/CALCIUM PBF) LIQD Take one tablespoon by mouth (3ml) and rinse solution for 30 seconds then spit out (Patient taking differently: 15 mLs by Mouth Rinse route See admin instructions. Rinse daily for 30 seconds, using 15 ml's, then spit out- as directed) 450 mL 0  . ONE TOUCH ULTRA TEST test strip 1 each by Other route as needed for other.     . psyllium (REGULOID) 0.52 G capsule Take 0.52 g by mouth 2 (two) times daily.     . valsartan (DIOVAN) 160 MG tablet Take 1 tablet (160 mg total) by mouth daily. 30 tablet 3  . vitamin E 600 UNIT capsule Take 600 Units by mouth daily.     No current facility-administered medications on file prior to visit.    Allergies  Allergen Reactions  . Codeine Other (See Comments)    FEELS  EXTREMELY "LOOPY"  . Grass Extracts [Gramineae Pollens] Itching and Other (See Comments)    Runny nose, itchy eyes, allergic symptoms  . Mold Extract [Trichophyton] Other (See Comments)    Takes allergy shots for this- sinus infections/congestion     Assessment/Plan:  1. Hypertension - BP near goal <130/80 mmHg. Medication adherence appears optimal and home BP readings in the last two weeks have improved averaging SBP 120-130s. Will make no medication changes today and continue amlodipine 5 mg daily, lisinopril40mg  daily, clonidine 0.2 mgBID and HCTZ 25 mg daily. Instructed patient to call pharmacy clinic if she has any medication issues or sees BP readings consistently >140/90. Patient verbalized understanding and will f/u as needed in HTN clinic.  Fabio Neighbors, PharmD PGY2 Ambulatory Care Pharmacy Resident [facility] [address] [place], [place] [address] Phone:  [phone]; Fax: [phone]

## 2020-04-24 ENCOUNTER — Ambulatory Visit (HOSPITAL_COMMUNITY)
Admission: RE | Admit: 2020-04-24 | Discharge: 2020-04-24 | Disposition: A | Discharge status: Home / Self Care | Payer: Medicare PPO | Source: Ambulatory Visit | Attending: Physician Assistant | Admitting: Physician Assistant

## 2020-04-24 ENCOUNTER — Other Ambulatory Visit: Payer: Self-pay

## 2020-04-24 DIAGNOSIS — R634 Abnormal weight loss: Secondary | ICD-10-CM | POA: Insufficient documentation

## 2020-04-24 DIAGNOSIS — R6881 Early satiety: Secondary | ICD-10-CM | POA: Diagnosis not present

## 2020-04-24 DIAGNOSIS — K219 Gastro-esophageal reflux disease without esophagitis: Secondary | ICD-10-CM | POA: Diagnosis not present

## 2020-04-24 DIAGNOSIS — R140 Abdominal distension (gaseous): Secondary | ICD-10-CM | POA: Diagnosis not present

## 2020-04-24 MED ORDER — TECHNETIUM TC 99M SULFUR COLLOID
2.1000 | Freq: Once | INTRAVENOUS | Status: AC | PRN
  Administered 2020-04-24: 2.1 via INTRAVENOUS

## 2020-04-26 ENCOUNTER — Other Ambulatory Visit: Payer: Self-pay

## 2020-04-26 ENCOUNTER — Ambulatory Visit (INDEPENDENT_AMBULATORY_CARE_PROVIDER_SITE_OTHER): Payer: Medicare PPO | Admitting: Pharmacist

## 2020-04-26 VITALS — BP 144/64 | HR 71

## 2020-04-26 DIAGNOSIS — I10 Essential (primary) hypertension: Secondary | ICD-10-CM | POA: Diagnosis not present

## 2020-04-26 NOTE — Progress Notes (Signed)
Patient ID: Haley Sanders                 DOB: 23-Nov-1932                      MRN: [id number]     HPI: Haley Sanders is a 87 y.o. female referred by Dr. Reed Breech HTN clinic. PMH is significant forchronic diastolic heart failure,sleep apneaon CPAP, DM, HLD, hypertrophicobstructivecardiomyopathy(HOCM),and depression. Of note, Dr. Eldridge Dace would like to avoid vasodilators due to hx of HOCM.    Last seen in HTN clinic on 01/17/20, BP was very close to goal. She was to follow up as needed. Patient called clinic on 1/10 stating she has had a cough for a while and GI thinks its from her lisinopril. We switched her to valsartan to see if this would help.  Patient presents today for follow up for her blood pressure. She states she has not noticed a difference in cough. Also suffers from GERD. Confirmed BP medications with patient. States today's blood pressure at home was higher than it has been. Denies dizziness, lightheadedness, headache, blurred vision, SOB, or swelling.  Current HTN meds:   Amlodipine 5 mg daily (AM)  Valsartan 160mg  daily  Clonidine 0.2 mgBID  HCTZ 25 mg daily(AM)   Previously tried: telmisartan/HCTZ 80/25, lisinopril (cough)  BP goal:<130/93mmHg  Family History:The patient's family history includes Alzheimer's disease in her sister; CAD in her son; Cancer in her mother; Heart attack in her father, maternal grandfather, and paternal grandmother; Stroke in her father.  Social History:Quit smoking in 1990  Diet:lives in retirement home, eats 1 meal in cafeteria. Usually vegetables, meat, starchy vegetable and desert.   Exercise:goes to exercise room. Tries to go 3 times a week.  Home BP readings: 141/80 HR 64, 120/64, 122/67, 139/77, 130/69, 100/58, 110/61, 124/69, 125/68, 118/68, 106/61, 116/63, 120/62, 128/67, 103/56, 115/69, 106/58, 122/72   Wt Readings from Last 3 Encounters:  10/11/19 161 lb 6.4 oz (73.2 kg)  09/29/19  161 lb 6.4 oz (73.2 kg)  07/25/19 164 lb (74.4 kg)   BP Readings from Last 3 Encounters:  01/17/20 134/80  12/20/19 (!) 162/80  12/06/19 (!) 145/70   Pulse Readings from Last 3 Encounters:  01/17/20 64  12/20/19 67  12/06/19 67    Renal function: CrCl cannot be calculated (Patient's most recent lab result is older than the maximum 21 days allowed.).  Past Medical History:  Diagnosis Date  . Depression   . Diabetes mellitus   . GERD (gastroesophageal reflux disease)   . Hypercholesterolemia   . Hypertension   . Hypothyroidism   . Memory deficit   . Osteoporosis     Current Outpatient Medications on File Prior to Visit  Medication Sig Dispense Refill  . albuterol (PROVENTIL HFA;VENTOLIN HFA) 108 (90 Base) MCG/ACT inhaler Inhale 2 puffs into the lungs every 6 (six) hours as needed for wheezing or shortness of breath.     12/08/19 amLODipine (NORVASC) 5 MG tablet Take 1 tablet (5 mg total) by mouth daily. 30 tablet 11  . aspirin 81 MG tablet Take 81 mg by mouth daily.     Marland Kitchen atorvastatin (LIPITOR) 40 MG tablet Take 40 mg by mouth daily.    . Azelastine HCl 0.15 % SOLN as needed.    Marland Kitchen buPROPion (ZYBAN) 150 MG 12 hr tablet Take 150 mg by mouth 2 (two) times daily.    . cloNIDine (CATAPRES) 0.2 MG tablet Take 0.2 mg by mouth  2 (two) times daily.    Marland Kitchen donepezil (ARICEPT) 10 MG tablet Take 10 mg by mouth at bedtime.     Marland Kitchen esomeprazole (NEXIUM) 40 MG capsule Take 40 mg by mouth 2 (two) times daily before a meal.     . fish oil-omega-3 fatty acids 1000 MG capsule Take 1 g by mouth daily.     Marland Kitchen FLUoxetine (PROZAC) 40 MG capsule Take 40 mg by mouth daily.    . hydrochlorothiazide (HYDRODIURIL) 25 MG tablet Take 1 tablet (25 mg total) by mouth daily for 30 days. 30 tablet 0  . levocetirizine (XYZAL) 5 MG tablet Take 5 mg by mouth every evening.     Marland Kitchen levothyroxine (SYNTHROID, LEVOTHROID) 100 MCG tablet Take 100 mcg by mouth daily before breakfast.     . metFORMIN (GLUCOPHAGE-XR) 500 MG 24 hr  tablet Take 500 mg by mouth daily with breakfast.   3  . montelukast (SINGULAIR) 10 MG tablet Take 10 mg by mouth at bedtime.    . Mouthwashes (BIOTENE/CALCIUM PBF) LIQD Take one tablespoon by mouth (95ml) and rinse solution for 30 seconds then spit out (Patient taking differently: 15 mLs by Mouth Rinse route See admin instructions. Rinse daily for 30 seconds, using 15 ml's, then spit out- as directed) 450 mL 0  . ONE TOUCH ULTRA TEST test strip 1 each by Other route as needed for other.     . psyllium (REGULOID) 0.52 G capsule Take 0.52 g by mouth 2 (two) times daily.     . valsartan (DIOVAN) 160 MG tablet Take 1 tablet (160 mg total) by mouth daily. 30 tablet 3  . vitamin E 600 UNIT capsule Take 600 Units by mouth daily.     No current facility-administered medications on file prior to visit.    Allergies  Allergen Reactions  . Codeine Other (See Comments)    FEELS  EXTREMELY "LOOPY"  . Grass Extracts [Gramineae Pollens] Itching and Other (See Comments)    Runny nose, itchy eyes, allergic symptoms  . Mold Extract [Trichophyton] Other (See Comments)    Takes allergy shots for this- sinus infections/congestion     Assessment/Plan:  1. Hypertension - BP in clinic today was above goal of <130/80. However it matched the reading she got at home today. The majority of her home readings have been at goal. Continue amlodipine 5mg  daily, valsartan 160mg  daily, clonidine 0.2mg  BID and HCTZ 25mg  daily. Follow up as needed.   , Pharm.D, BCPS, CPP [facility]  [address] [place], [place] [address]  Phone: [phone]; Fax: [phone]

## 2020-04-26 NOTE — Patient Instructions (Signed)
It was nice to see you today!  Please continue  Amlodipine 5 mg daily (AM)  Valsartan 160mg  daily  Clonidine 0.2 mgBID  HCTZ 25 mg daily(AM)   Call me at [phone] with any issues

## 2020-05-01 ENCOUNTER — Other Ambulatory Visit: Payer: Self-pay

## 2020-05-01 MED ORDER — VALSARTAN 160 MG PO TABS: 160.0000 mg | ORAL_TABLET | Freq: Every day | ORAL | 1 refills | Status: DC

## 2020-05-03 ENCOUNTER — Other Ambulatory Visit: Payer: Self-pay

## 2020-05-07 ENCOUNTER — Other Ambulatory Visit (HOSPITAL_COMMUNITY)
Admission: RE | Admit: 2020-05-07 | Discharge: 2020-05-07 | Disposition: A | Discharge status: Home / Self Care | Payer: Medicare PPO | Source: Ambulatory Visit | Attending: Gastroenterology | Admitting: Gastroenterology

## 2020-05-07 DIAGNOSIS — Z20822 Contact with and (suspected) exposure to covid-19: Secondary | ICD-10-CM | POA: Diagnosis not present

## 2020-05-07 DIAGNOSIS — Z01812 Encounter for preprocedural laboratory examination: Secondary | ICD-10-CM | POA: Diagnosis not present

## 2020-05-08 LAB — SARS CORONAVIRUS 2 (TAT 6-24 HRS): SARS Coronavirus 2: NEGATIVE

## 2020-05-09 NOTE — Anesthesia Preprocedure Evaluation (Addendum)
Anesthesia Evaluation  Patient identified by MRN, date of birth, ID band Patient awake    Reviewed: Allergy & Precautions, H&P , NPO status , Patient's Chart, lab work & pertinent test results  Airway Mallampati: II  TM Distance: >3 FB Neck ROM: Full    Dental no notable dental hx. (+) Upper Dentures, Partial Lower, Dental Advisory Given   Pulmonary asthma , former smoker,    Pulmonary exam normal breath sounds clear to auscultation       Cardiovascular Exercise Tolerance: Good hypertension, Pt. on medications  Rhythm:Regular Rate:Normal     Neuro/Psych Depression negative neurological ROS     GI/Hepatic Neg liver ROS, GERD  Medicated,  Endo/Other  diabetes, Type 2, Oral Hypoglycemic AgentsHypothyroidism   Renal/GU negative Renal ROS  negative genitourinary   Musculoskeletal   Abdominal   Peds  Hematology negative hematology ROS (+)   Anesthesia Other Findings   Reproductive/Obstetrics negative OB ROS                            Anesthesia Physical Anesthesia Plan  ASA: II  Anesthesia Plan: MAC   Post-op Pain Management:    Induction: Intravenous  PONV Risk Score and Plan: 2 and Propofol infusion and Treatment may vary due to age or medical condition  Airway Management Planned: Nasal Cannula  Additional Equipment:   Intra-op Plan:   Post-operative Plan:   Informed Consent: I have reviewed the patients History and Physical, chart, labs and discussed the procedure including the risks, benefits and alternatives for the proposed anesthesia with the patient or authorized representative who has indicated his/her understanding and acceptance.     Dental advisory given  Plan Discussed with: CRNA  Anesthesia Plan Comments:        Anesthesia Quick Evaluation

## 2020-05-10 ENCOUNTER — Ambulatory Visit (HOSPITAL_COMMUNITY): Payer: Medicare PPO | Admitting: Anesthesiology

## 2020-05-10 ENCOUNTER — Ambulatory Visit (HOSPITAL_COMMUNITY)
Admission: RE | Admit: 2020-05-10 | Discharge: 2020-05-10 | Disposition: A | Discharge status: Home / Self Care | Payer: Medicare PPO | Attending: Gastroenterology | Admitting: Gastroenterology

## 2020-05-10 ENCOUNTER — Encounter (HOSPITAL_COMMUNITY)
Admission: RE | Disposition: A | Discharge status: Home / Self Care | Payer: Self-pay | Source: Home / Self Care | Attending: Gastroenterology

## 2020-05-10 ENCOUNTER — Encounter (HOSPITAL_COMMUNITY): Payer: Self-pay | Admitting: Gastroenterology

## 2020-05-10 DIAGNOSIS — E7800 Pure hypercholesterolemia, unspecified: Secondary | ICD-10-CM | POA: Diagnosis not present

## 2020-05-10 DIAGNOSIS — I10 Essential (primary) hypertension: Secondary | ICD-10-CM | POA: Diagnosis not present

## 2020-05-10 DIAGNOSIS — R112 Nausea with vomiting, unspecified: Secondary | ICD-10-CM | POA: Diagnosis not present

## 2020-05-10 DIAGNOSIS — Z6828 Body mass index (BMI) 28.0-28.9, adult: Secondary | ICD-10-CM | POA: Diagnosis not present

## 2020-05-10 DIAGNOSIS — R1013 Epigastric pain: Secondary | ICD-10-CM | POA: Diagnosis not present

## 2020-05-10 DIAGNOSIS — Z79899 Other long term (current) drug therapy: Secondary | ICD-10-CM | POA: Insufficient documentation

## 2020-05-10 DIAGNOSIS — R630 Anorexia: Secondary | ICD-10-CM | POA: Insufficient documentation

## 2020-05-10 DIAGNOSIS — K449 Diaphragmatic hernia without obstruction or gangrene: Secondary | ICD-10-CM | POA: Insufficient documentation

## 2020-05-10 DIAGNOSIS — R634 Abnormal weight loss: Secondary | ICD-10-CM | POA: Diagnosis not present

## 2020-05-10 DIAGNOSIS — K317 Polyp of stomach and duodenum: Secondary | ICD-10-CM | POA: Diagnosis not present

## 2020-05-10 DIAGNOSIS — Z87891 Personal history of nicotine dependence: Secondary | ICD-10-CM | POA: Diagnosis not present

## 2020-05-10 HISTORY — PX: BIOPSY: SHX5522

## 2020-05-10 HISTORY — PX: ESOPHAGOGASTRODUODENOSCOPY (EGD) WITH PROPOFOL: SHX5813

## 2020-05-10 LAB — GLUCOSE, CAPILLARY: Glucose-Capillary: 172 mg/dL — ABNORMAL HIGH (ref 70–99)

## 2020-05-10 SURGERY — ESOPHAGOGASTRODUODENOSCOPY (EGD) WITH PROPOFOL
Anesthesia: Monitor Anesthesia Care

## 2020-05-10 MED ORDER — PROPOFOL 500 MG/50ML IV EMUL
INTRAVENOUS | Status: DC | PRN
  Administered 2020-05-10: 50 ug/kg/min via INTRAVENOUS
  Administered 2020-05-10: 40 mg via INTRAVENOUS

## 2020-05-10 MED ORDER — PROPOFOL 1000 MG/100ML IV EMUL
INTRAVENOUS | Status: AC
  Filled 2020-05-10: qty 200

## 2020-05-10 MED ORDER — PROPOFOL 500 MG/50ML IV EMUL
INTRAVENOUS | Status: AC
  Filled 2020-05-10: qty 50

## 2020-05-10 MED ORDER — LACTATED RINGERS IV SOLN: INTRAVENOUS | Status: DC | PRN

## 2020-05-10 MED ORDER — SODIUM CHLORIDE 0.9 % IV SOLN: INTRAVENOUS | Status: DC

## 2020-05-10 SURGICAL SUPPLY — 15 items

## 2020-05-10 NOTE — Progress Notes (Signed)
Haley Sanders 7:56 AM  Subjective: Patient doing fairly well since she was last seen by our PA in the office with alternating diarrhea constipation periodic abdominal pain nausea vomiting bloating but some decreased appetite and weight loss and negative work-up to date  Objective: Vital signs stable afebrile exam please see preassessment evaluation previous eval reviewed  Assessment: Multiple GI symptoms mostly decreased appetite weight loss some periodic nausea vomiting abdominal pain  Plan: Okay to proceed with endoscopy with anesthesia assistance  [facility]  office [phone] After 5PM or if no answer call [phone]

## 2020-05-10 NOTE — Discharge Instructions (Signed)
Call if question or problem otherwise soft solids today and call for biopsy report in 1 week and symptom update  YOU HAD AN ENDOSCOPIC PROCEDURE TODAY: Refer to the procedure report and other information in the discharge instructions given to you for any specific questions about what was found during the examination. If this information does not answer your questions, please call [facility] office at [phone] to clarify.   YOU SHOULD EXPECT: Some feelings of bloating in the abdomen. Passage of more gas than usual. Walking can help get rid of the air that was put into your GI tract during the procedure and reduce the bloating. If you had a lower endoscopy (such as a colonoscopy or flexible sigmoidoscopy) you may notice spotting of blood in your stool or on the toilet paper. Some abdominal soreness may be present for a day or two, also.  DIET: Your first meal following the procedure should be a light meal and then it is ok to progress to your normal diet. A half-sandwich or bowl of soup is an example of a good first meal. Heavy or fried foods are harder to digest and may make you feel nauseous or bloated. Drink plenty of fluids but you should avoid alcoholic beverages for 24 hours. If you had a esophageal dilation, please see attached instructions for diet.    ACTIVITY: Your care partner should take you home directly after the procedure. You should plan to take it easy, moving slowly for the rest of the day. You can resume normal activity the day after the procedure however YOU SHOULD NOT DRIVE, use power tools, machinery or perform tasks that involve climbing or major physical exertion for 24 hours (because of the sedation medicines used during the test).   SYMPTOMS TO REPORT IMMEDIATELY: A gastroenterologist can be reached at any hour. Please call [phone]  for any of the following symptoms:  . Following lower endoscopy (colonoscopy, flexible sigmoidoscopy) Excessive amounts of blood in the stool   Significant tenderness, worsening of abdominal pains  Swelling of the abdomen that is new, acute  Fever of 100 or higher  . Following upper endoscopy (EGD, EUS, ERCP, esophageal dilation) Vomiting of blood or coffee ground material  New, significant abdominal pain  New, significant chest pain or pain under the shoulder blades  Painful or persistently difficult swallowing  New shortness of breath  Black, tarry-looking or red, bloody stools  FOLLOW UP:  If any biopsies were taken you will be contacted by phone or by letter within the next 1-3 weeks. Call [phone]  if you have not heard about the biopsies in 3 weeks.  Please also call with any specific questions about appointments or follow up tests. YOU HAD AN ENDOSCOPIC PROCEDURE TODAY: Refer to the procedure report and other information in the discharge instructions given to you for any specific questions about what was found during the examination. If this information does not answer your questions, please call [facility] office at [phone] to clarify.   YOU SHOULD EXPECT: Some feelings of bloating in the abdomen. Passage of more gas than usual. Walking can help get rid of the air that was put into your GI tract during the procedure and reduce the bloating. If you had a lower endoscopy (such as a colonoscopy or flexible sigmoidoscopy) you may notice spotting of blood in your stool or on the toilet paper. Some abdominal soreness may be present for a day or two, also.  DIET: Your first meal following the procedure should  be a light meal and then it is ok to progress to your normal diet. A half-sandwich or bowl of soup is an example of a good first meal. Heavy or fried foods are harder to digest and may make you feel nauseous or bloated. Drink plenty of fluids but you should avoid alcoholic beverages for 24 hours. If you had a esophageal dilation, please see attached instructions for diet.    ACTIVITY: Your care partner should take you home  directly after the procedure. You should plan to take it easy, moving slowly for the rest of the day. You can resume normal activity the day after the procedure however YOU SHOULD NOT DRIVE, use power tools, machinery or perform tasks that involve climbing or major physical exertion for 24 hours (because of the sedation medicines used during the test).   SYMPTOMS TO REPORT IMMEDIATELY: A gastroenterologist can be reached at any hour. Please call [phone]  for any of the following symptoms:   Following upper endoscopy (EGD, EUS, ERCP, esophageal dilation) Vomiting of blood or coffee ground material  New, significant abdominal pain  New, significant chest pain or pain under the shoulder blades  Painful or persistently difficult swallowing  New shortness of breath  Black, tarry-looking or red, bloody stools  FOLLOW UP:  If any biopsies were taken you will be contacted by phone or by letter within the next 1-3 weeks. Call [phone]  if you have not heard about the biopsies in 3 weeks.  Please also call with any specific questions about appointments or follow up tests.

## 2020-05-10 NOTE — Op Note (Signed)
[facility] Patient Name: Haley Sanders Procedure Date: 05/10/2020 MRN: [id number] Attending MD: Vida Rigger , MD Date of Birth: 1932/10/13 CSN: [id number] Age: 87 Admit Type: Outpatient Procedure:                Upper GI endoscopy Indications:              Epigastric abdominal pain, Anorexia, Nausea with                            vomiting, Weight loss Providers:                Vida Rigger, MD, Fayrene Fearing, RN, Sunday Corn Mbumina,                            Technician Referring MD:              Medicines:                Propofol total dose 120 mg IV, 40 mg lidocaine Complications:            No immediate complications. Estimated Blood Loss:     Estimated blood loss: none. Procedure:                Pre-Anesthesia Assessment:                           - Prior to the procedure, a History and Physical                            was performed, and patient medications and                            allergies were reviewed. The patient's tolerance of                            previous anesthesia was also reviewed. The risks                            and benefits of the procedure and the sedation                            options and risks were discussed with the patient.                            All questions were answered, and informed consent                            was obtained. Prior Anticoagulants: The patient has                            taken no previous anticoagulant or antiplatelet                            agents. ASA Grade Assessment: III - A patient with  severe systemic disease. After reviewing the risks                            and benefits, the patient was deemed in                            satisfactory condition to undergo the procedure.                           After obtaining informed consent, the endoscope was                            passed under direct vision. Throughout the                            procedure,  the patient's blood pressure, pulse, and                            oxygen saturations were monitored continuously. The                            GIF-H190 ([id number] Olympus gastroscope was                            introduced through the mouth, and advanced to the                            third part of duodenum. The upper GI endoscopy was                            accomplished without difficulty. The patient                            tolerated the procedure well. Scope In: Scope Out: Findings:      The larynx was normal.      A medium-sized hiatal hernia was present.      A single small semi-sessile polyp with no bleeding and no stigmata of       recent bleeding was found at the gastroesophageal junction in the hiatal       hernia pouch. Biopsies were taken with a cold forceps for histology.      The duodenal bulb, first portion of the duodenum, second portion of the       duodenum and third portion of the duodenum were normal.      The exam was otherwise without abnormality. Impression:               - Normal larynx.                           - Medium-sized hiatal hernia.                           - A single gastroesophageal junction polyp in the  hiatal hernia pouch. Biopsied.                           - Normal duodenal bulb, first portion of the                            duodenum, second portion of the duodenum and third                            portion of the duodenum.                           - The examination was otherwise normal. Moderate Sedation:      Not Applicable - Patient had care per Anesthesia. Recommendation:           - Patient has a contact number available for                            emergencies. The signs and symptoms of potential                            delayed complications were discussed with the                            patient. Return to normal activities tomorrow.                            Written discharge  instructions were provided to the                            patient.                           - Soft diet today.                           - Continue present medications.                           - Await pathology results.                           - Return to GI clinic PRN.                           - Telephone GI clinic for pathology results in 1                            week.                           - Telephone GI clinic if symptomatic PRN. Procedure Code(s):        --- Professional ---                           [phone], Esophagogastroduodenoscopy, flexible,  transoral; with biopsy, single or multiple Diagnosis Code(s):        --- Professional ---                           K44.9, Diaphragmatic hernia without obstruction or                            gangrene                           K31.7, Polyp of stomach and duodenum                           R10.13, Epigastric pain                           R63.0, Anorexia                           R11.2, Nausea with vomiting, unspecified                           R63.4, Abnormal weight loss CPT copyright 2019 [employer]. All rights reserved. The codes documented in this report are preliminary and upon coder review may  be revised to meet current compliance requirements. Vida Rigger, MD 05/10/2020 8:29:20 AM This report has been signed electronically. Number of Addenda: 0

## 2020-05-10 NOTE — Transfer of Care (Signed)
Immediate Anesthesia Transfer of Care Note  Patient: Haley Sanders  Procedure(s) Performed: Procedure(s): ESOPHAGOGASTRODUODENOSCOPY (EGD) WITH PROPOFOL (N/A) BIOPSY  Patient Location: PACU and Endoscopy Unit  Anesthesia Type:MAC  Level of Consciousness: awake, alert  and oriented  Airway & Oxygen Therapy: Patient Spontanous Breathing and Patient connected to nasal cannula oxygen  Post-op Assessment: Report given to RN and Post -op Vital signs reviewed and stable  Post vital signs: Reviewed and stable  Last Vitals:  Vitals:   05/10/20 0710 05/10/20 0820  BP: (!) 145/54 (!) 138/58  Pulse: (!) 56 67  Resp: 20 (!) 24  Temp: (!) 36.1 C   SpO2:  47%    Complications: No apparent anesthesia complications

## 2020-05-10 NOTE — Anesthesia Postprocedure Evaluation (Signed)
Anesthesia Post Note  Patient: Haley Sanders  Procedure(s) Performed: ESOPHAGOGASTRODUODENOSCOPY (EGD) WITH PROPOFOL (N/A ) BIOPSY     Patient location during evaluation: Endoscopy Anesthesia Type: MAC Level of consciousness: awake and alert Pain management: pain level controlled Vital Signs Assessment: post-procedure vital signs reviewed and stable Respiratory status: spontaneous breathing, nonlabored ventilation and respiratory function stable Cardiovascular status: stable and blood pressure returned to baseline Postop Assessment: no apparent nausea or vomiting Anesthetic complications: no   No complications documented.  Last Vitals:  Vitals:   05/10/20 0830 05/10/20 0840  BP: (!) 134/55 (!) 139/59  Pulse:  (!) 59  Resp: (!) 22 17  Temp:    SpO2: 95% 94%    Last Pain:  Vitals:   05/10/20 0840  TempSrc:   PainSc: 0-No pain                 Tyr Franca,W. EDMOND

## 2020-05-11 ENCOUNTER — Encounter (HOSPITAL_COMMUNITY): Payer: Self-pay | Admitting: Gastroenterology

## 2020-05-11 LAB — SURGICAL PATHOLOGY

## 2020-05-15 DIAGNOSIS — F0390 Unspecified dementia without behavioral disturbance: Secondary | ICD-10-CM | POA: Diagnosis not present

## 2020-05-15 DIAGNOSIS — I129 Hypertensive chronic kidney disease with stage 1 through stage 4 chronic kidney disease, or unspecified chronic kidney disease: Secondary | ICD-10-CM | POA: Diagnosis not present

## 2020-05-15 DIAGNOSIS — J449 Chronic obstructive pulmonary disease, unspecified: Secondary | ICD-10-CM | POA: Diagnosis not present

## 2020-05-15 DIAGNOSIS — N1831 Chronic kidney disease, stage 3a: Secondary | ICD-10-CM | POA: Diagnosis not present

## 2020-05-15 DIAGNOSIS — E039 Hypothyroidism, unspecified: Secondary | ICD-10-CM | POA: Diagnosis not present

## 2020-05-15 DIAGNOSIS — E1140 Type 2 diabetes mellitus with diabetic neuropathy, unspecified: Secondary | ICD-10-CM | POA: Diagnosis not present

## 2020-05-15 DIAGNOSIS — K5900 Constipation, unspecified: Secondary | ICD-10-CM | POA: Diagnosis not present

## 2020-05-31 DIAGNOSIS — B351 Tinea unguium: Secondary | ICD-10-CM | POA: Diagnosis not present

## 2020-05-31 DIAGNOSIS — M79675 Pain in left toe(s): Secondary | ICD-10-CM | POA: Diagnosis not present

## 2020-05-31 DIAGNOSIS — M2041 Other hammer toe(s) (acquired), right foot: Secondary | ICD-10-CM | POA: Diagnosis not present

## 2020-05-31 DIAGNOSIS — G609 Hereditary and idiopathic neuropathy, unspecified: Secondary | ICD-10-CM | POA: Diagnosis not present

## 2020-06-06 DIAGNOSIS — K5900 Constipation, unspecified: Secondary | ICD-10-CM | POA: Diagnosis not present

## 2020-06-06 DIAGNOSIS — R630 Anorexia: Secondary | ICD-10-CM | POA: Diagnosis not present

## 2020-06-14 DIAGNOSIS — L723 Sebaceous cyst: Secondary | ICD-10-CM | POA: Diagnosis not present

## 2020-07-09 ENCOUNTER — Other Ambulatory Visit (HOSPITAL_COMMUNITY): Payer: Medicare PPO

## 2020-07-23 DIAGNOSIS — J449 Chronic obstructive pulmonary disease, unspecified: Secondary | ICD-10-CM | POA: Diagnosis not present

## 2020-07-23 DIAGNOSIS — G4733 Obstructive sleep apnea (adult) (pediatric): Secondary | ICD-10-CM | POA: Diagnosis not present

## 2020-09-04 DIAGNOSIS — E039 Hypothyroidism, unspecified: Secondary | ICD-10-CM | POA: Diagnosis not present

## 2020-09-04 DIAGNOSIS — E1140 Type 2 diabetes mellitus with diabetic neuropathy, unspecified: Secondary | ICD-10-CM | POA: Diagnosis not present

## 2020-09-04 DIAGNOSIS — F0390 Unspecified dementia without behavioral disturbance: Secondary | ICD-10-CM | POA: Diagnosis not present

## 2020-09-04 DIAGNOSIS — I129 Hypertensive chronic kidney disease with stage 1 through stage 4 chronic kidney disease, or unspecified chronic kidney disease: Secondary | ICD-10-CM | POA: Diagnosis not present

## 2020-09-04 DIAGNOSIS — K5900 Constipation, unspecified: Secondary | ICD-10-CM | POA: Diagnosis not present

## 2020-09-04 DIAGNOSIS — N1831 Chronic kidney disease, stage 3a: Secondary | ICD-10-CM | POA: Diagnosis not present

## 2020-09-04 DIAGNOSIS — J449 Chronic obstructive pulmonary disease, unspecified: Secondary | ICD-10-CM | POA: Diagnosis not present

## 2020-09-26 ENCOUNTER — Other Ambulatory Visit: Payer: Self-pay | Admitting: Interventional Cardiology

## 2020-10-10 ENCOUNTER — Other Ambulatory Visit: Payer: Self-pay | Admitting: Internal Medicine

## 2020-10-10 DIAGNOSIS — Z1231 Encounter for screening mammogram for malignant neoplasm of breast: Secondary | ICD-10-CM

## 2020-10-31 DIAGNOSIS — G4733 Obstructive sleep apnea (adult) (pediatric): Secondary | ICD-10-CM | POA: Diagnosis not present

## 2020-10-31 DIAGNOSIS — J449 Chronic obstructive pulmonary disease, unspecified: Secondary | ICD-10-CM | POA: Diagnosis not present

## 2020-11-06 IMAGING — MG DIGITAL SCREENING BILATERAL MAMMOGRAM WITH TOMO AND CAD
8 series · 8 of 24 positions shown · non-contrast
Comparison: Previous exam(s).

CLINICAL DATA: Screening.

EXAM:
DIGITAL SCREENING BILATERAL MAMMOGRAM WITH TOMO AND CAD

[R CC synth-2D]
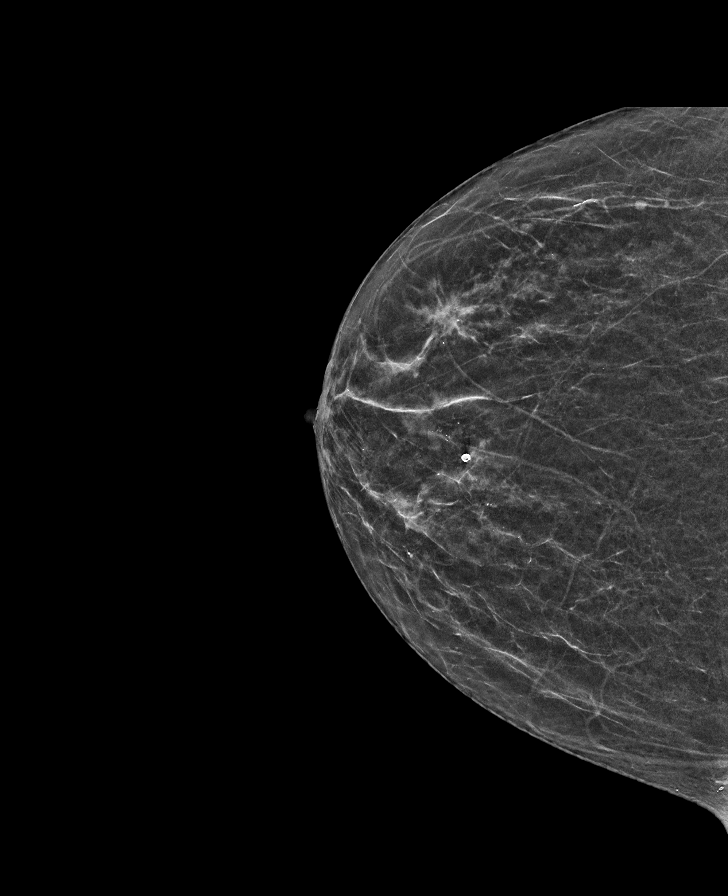

[L CC synth-2D]
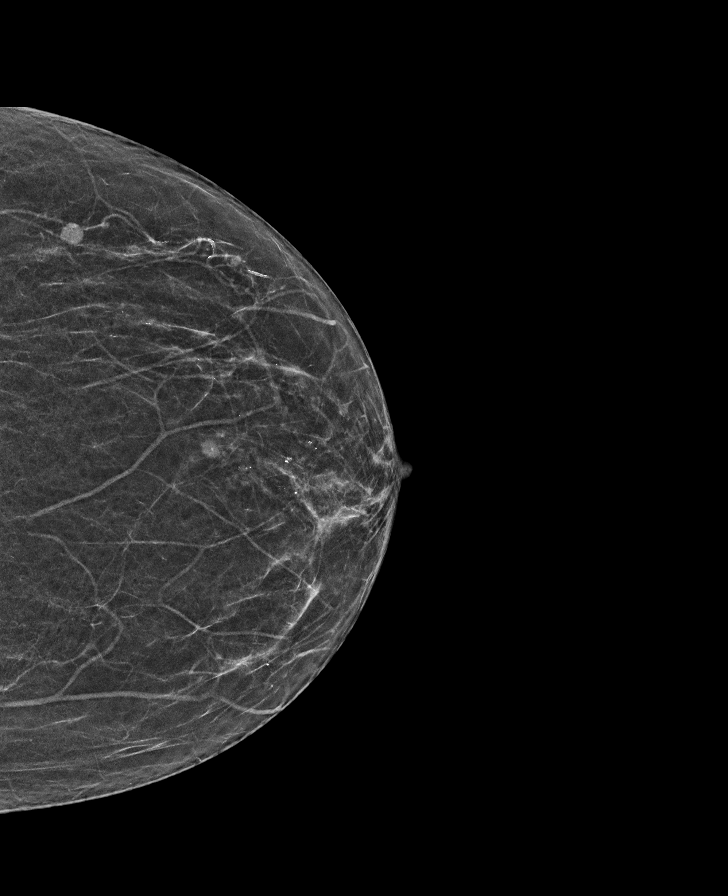

[L MLO synth-2D]
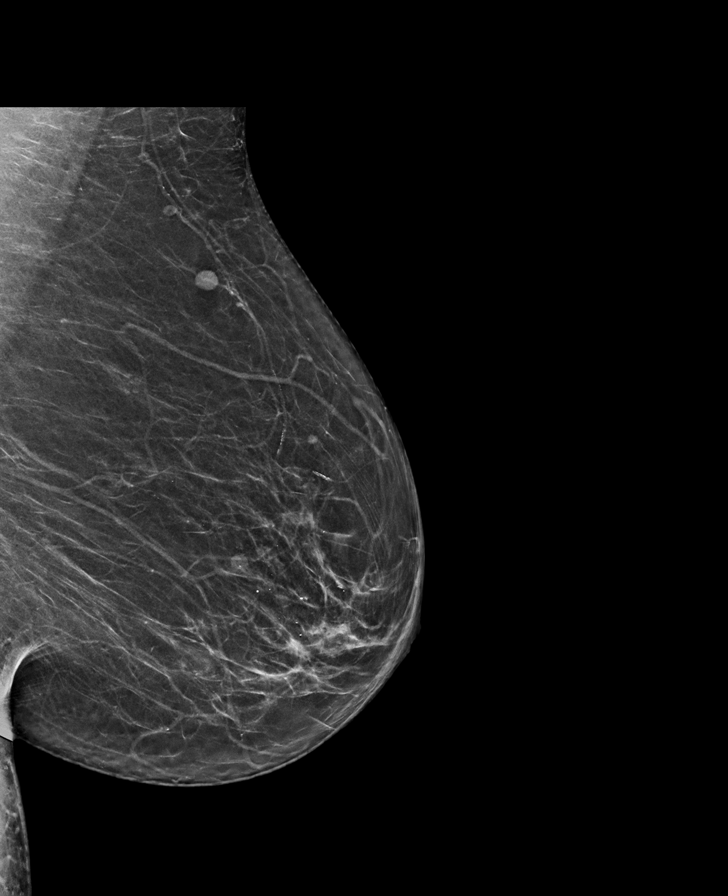

[R MLO synth-2D]
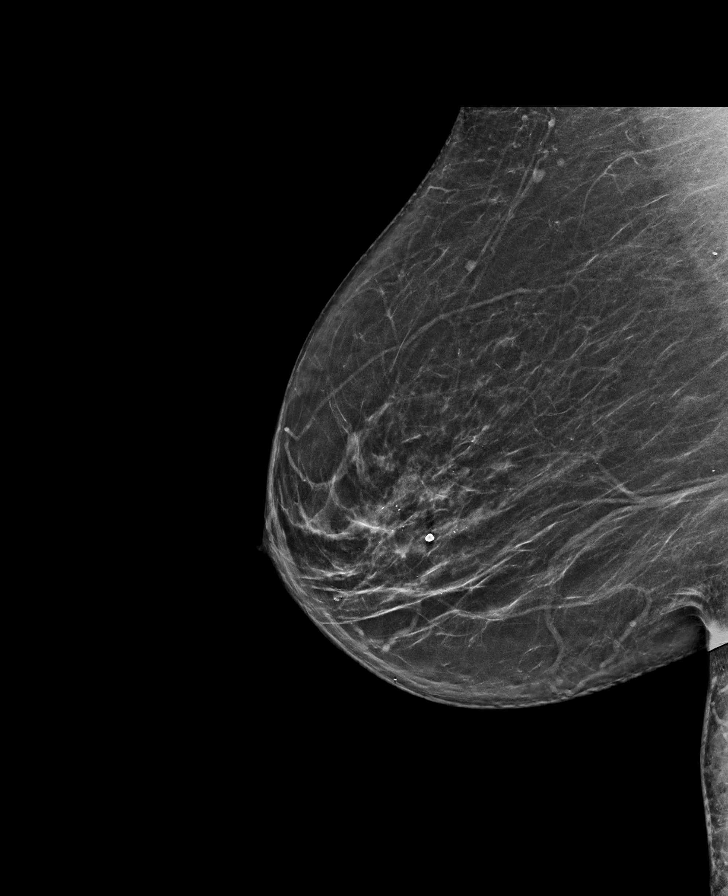

[L CC tomo · tomo slice 27/52.0]
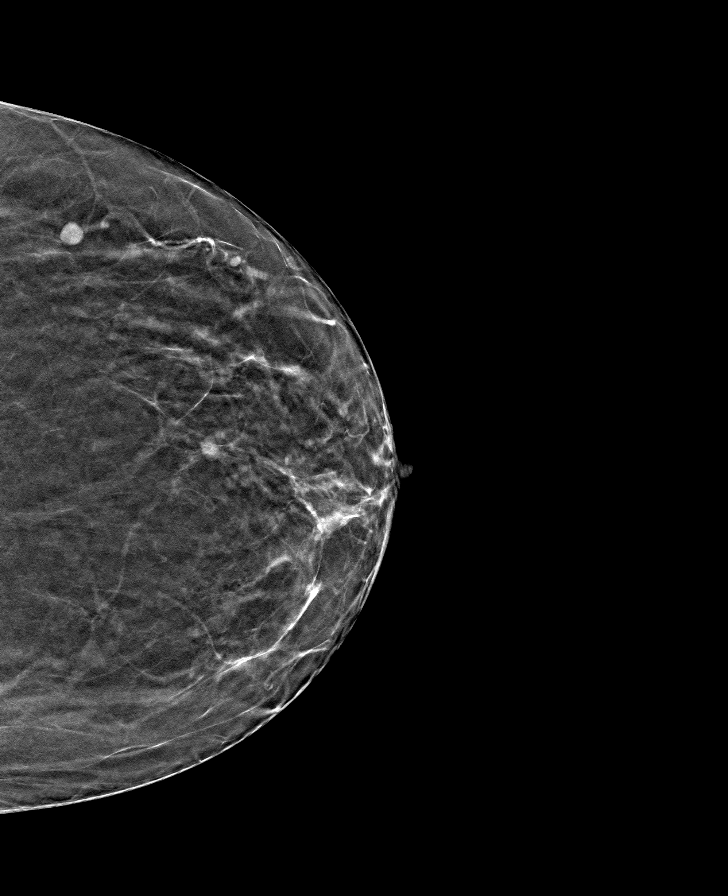

[L MLO tomo · tomo slice 35/69.0]
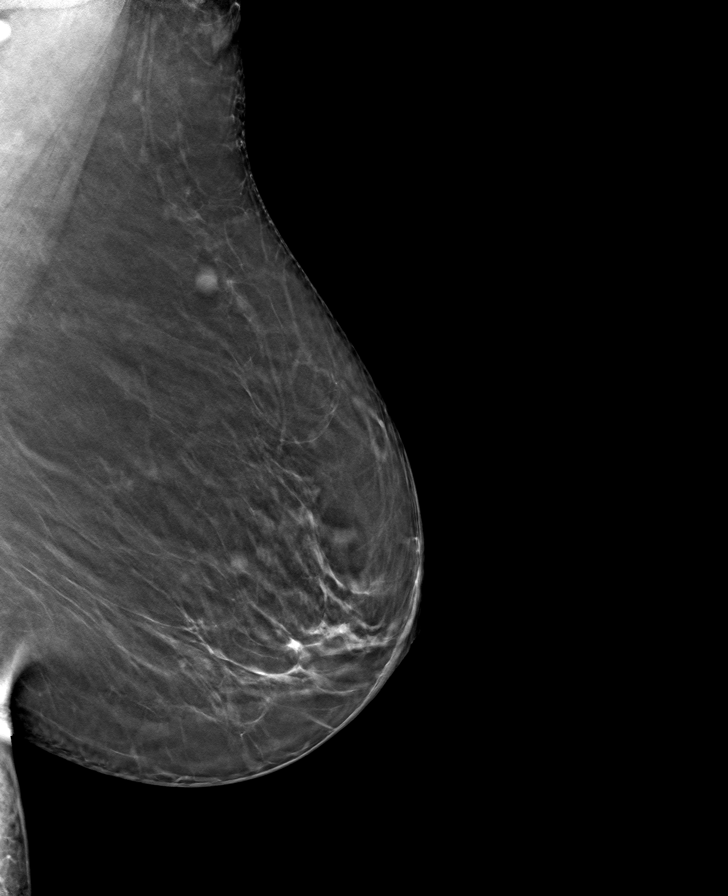

[R MLO tomo · tomo slice 36/71.0]
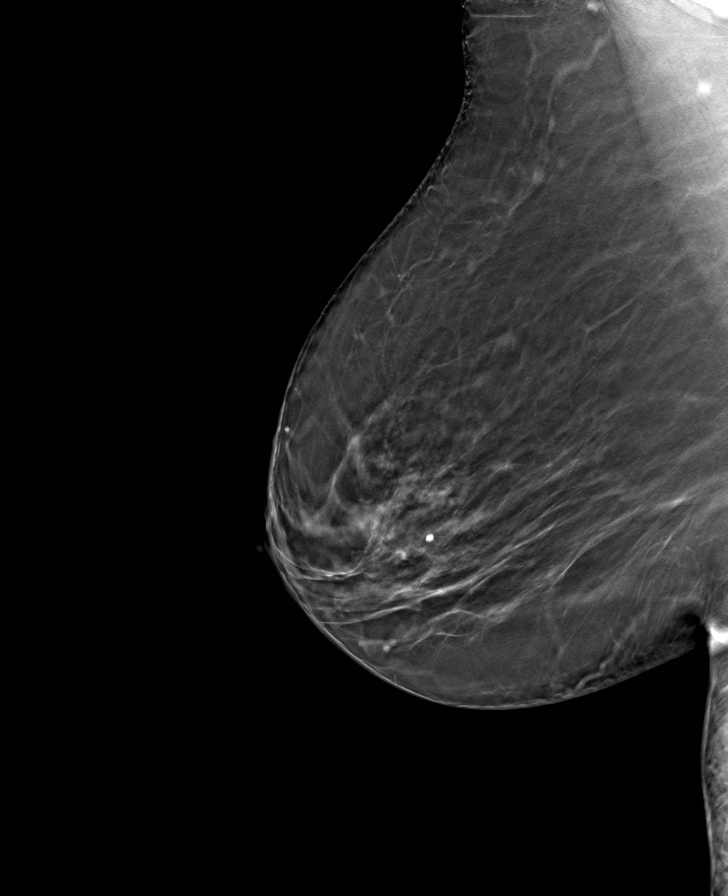

[R CC tomo · tomo slice 27/54.0]
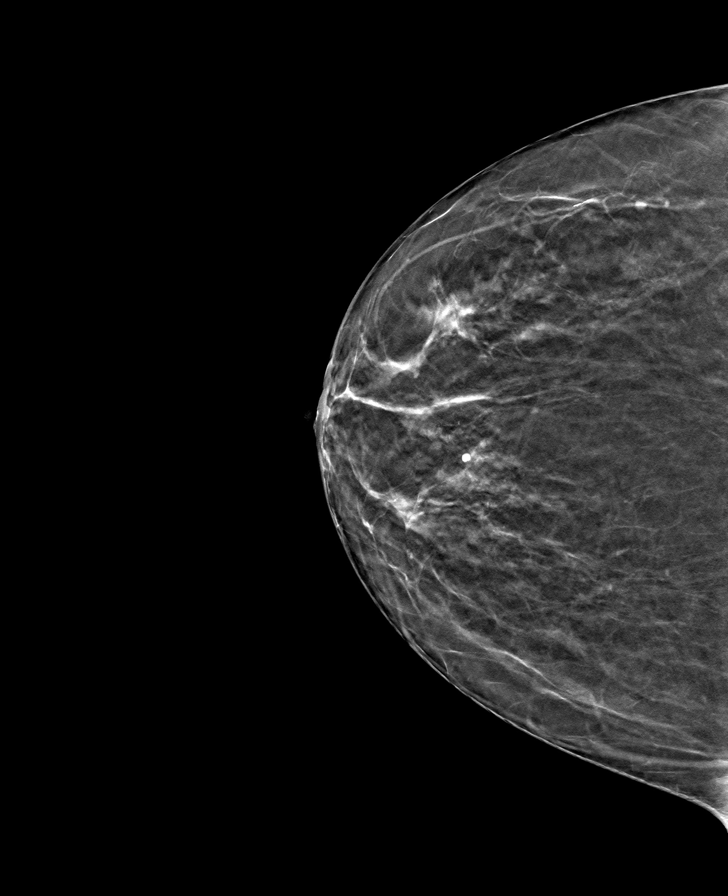

[8 of 24 positions shown; findings below may reference images not displayed]

ACR Breast Density Category b: There are scattered areas of
fibroglandular density.
FINDINGS: There are no findings suspicious for malignancy. Images were
processed with CAD.
IMPRESSION: No mammographic evidence of malignancy. A result letter of this
screening mammogram will be mailed directly to the patient.

RECOMMENDATION:
Screening mammogram in one year. (Code:[id number])

BI-RADS CATEGORY  1: Negative.

## 2020-11-29 ENCOUNTER — Other Ambulatory Visit: Payer: Self-pay

## 2020-11-29 ENCOUNTER — Ambulatory Visit (INDEPENDENT_AMBULATORY_CARE_PROVIDER_SITE_OTHER): Payer: Medicare PPO | Admitting: Endocrinology

## 2020-11-29 ENCOUNTER — Encounter: Payer: Self-pay | Admitting: Endocrinology

## 2020-11-29 VITALS — BP 160/80 | HR 75 | Ht 61.0 in | Wt 141.6 lb

## 2020-11-29 DIAGNOSIS — E119 Type 2 diabetes mellitus without complications: Secondary | ICD-10-CM | POA: Diagnosis not present

## 2020-11-29 DIAGNOSIS — E1122 Type 2 diabetes mellitus with diabetic chronic kidney disease: Secondary | ICD-10-CM

## 2020-11-29 DIAGNOSIS — N183 Chronic kidney disease, stage 3 unspecified: Secondary | ICD-10-CM

## 2020-11-29 LAB — POCT GLYCOSYLATED HEMOGLOBIN (HGB A1C): Hemoglobin A1C: 7 % — AB (ref 4.0–5.6)

## 2020-11-29 LAB — BASIC METABOLIC PANEL
BUN: 16 mg/dL (ref 6–23)
CO2: 30 mEq/L (ref 19–32)
Calcium: 9.6 mg/dL (ref 8.4–10.5)
Chloride: 100 mEq/L (ref 96–112)
Creatinine, Ser: 0.97 mg/dL (ref 0.40–1.20)
GFR: 52.93 mL/min — ABNORMAL LOW (ref >60.00)
Glucose, Bld: 96 mg/dL (ref 70–99)
Potassium: 3.8 mEq/L (ref 3.5–5.1)
Sodium: 138 mEq/L (ref 135–145)

## 2020-11-29 LAB — T3, FREE: T3, Free: 2.6 pg/mL (ref 2.3–4.2)

## 2020-11-29 LAB — TSH: TSH: 2.4 u[IU]/mL (ref 0.35–5.50)

## 2020-11-29 LAB — T4, FREE: Free T4: 0.84 ng/dL (ref 0.60–1.60)

## 2020-11-29 MED ORDER — REPAGLINIDE 0.5 MG PO TABS: 0.2500 mg | ORAL_TABLET | Freq: Two times a day (BID) | ORAL | 11 refills | Status: DC

## 2020-11-29 MED ORDER — METFORMIN HCL ER 500 MG PO TB24: 500.0000 mg | ORAL_TABLET | Freq: Every day | ORAL | 3 refills | Status: DC

## 2020-11-29 NOTE — Patient Instructions (Addendum)
good diet and exercise significantly improve the control of your diabetes.  please let me know if you wish to be referred to a dietician.  high blood sugar is very risky to your health.  you should see an eye doctor and dentist every year.  It is very important to get all recommended vaccinations.  Controlling your blood pressure and cholesterol drastically reduces the damage diabetes does to your body.  Those who smoke should quit.  Please discuss these with your doctor.  check your blood sugar once a day.  vary the time of day when you check, between before the 3 meals, and at bedtime.  also check if you have symptoms of your blood sugar being too high or too low.  please keep a record of the readings and bring it to your next appointment here (or you can bring the meter itself).  You can write it on any piece of paper.  please call us sooner if your blood sugar goes below 70, or if most of your readings are over 200. We will need to take this complex situation in stages. For now, please: Reduce the metformin, and: I have sent a prescription to your pharmacy, to add "repaglinide." Blood tests are requested for you today.  We'll let you know about the results.  Please come back for a follow-up appointment in 2 months.

## 2020-11-29 NOTE — Progress Notes (Signed)
Subjective:    Patient ID: Haley Sanders, female    DOB: 07/25/1932, 87 y.o.   MRN: [id number]  HPI  pt is referred by Dr Felipa Eth, for diabetes.  Pt states DM was dx'ed in approx 2002; it is complicated by stage 3 CRI and PN; she has never been on insulin; pt says her diet and exercise are; she has never had GDM, pancreatitis, pancreatic surgery, severe hypoglycemia or DKA.  She has lost 30 lbs x 10 months.  She takes metformin 1000/d.  Pt says fasting cbg's are in the low-100's.  She had thyroidect in 2005.   Past Medical History:  Diagnosis Date   Depression    Diabetes mellitus    GERD (gastroesophageal reflux disease)    Hypercholesterolemia    Hypertension    Hypothyroidism    Memory deficit    Osteoporosis     Past Surgical History:  Procedure Laterality Date   ABDOMINAL HYSTERECTOMY  2001   BIOPSY  05/10/2020   Procedure: BIOPSY;  Surgeon: Vida Rigger, MD;  Location: [facility];  Service: Endoscopy;;   ESOPHAGOGASTRODUODENOSCOPY (EGD) WITH PROPOFOL N/A 05/10/2020   Procedure: ESOPHAGOGASTRODUODENOSCOPY (EGD) WITH PROPOFOL;  Surgeon: Vida Rigger, MD;  Location: [facility];  Service: Endoscopy;  Laterality: N/A;   FOOT SURGERY  1990   right foot   ROTATOR CUFF REPAIR  1991   right   ROTATOR CUFF REPAIR  2001   left side   TONSILLECTOMY  1959   TUBAL LIGATION  1975    Social History   Socioeconomic History   Marital status: Widowed    Spouse name: Not on file   Number of children: Not on file   Years of education: Not on file   Highest education level: Not on file  Occupational History   Not on file  Tobacco Use   Smoking status: Former    Packs/day: 1.00    Years: 33.00    Pack years: 33.00    Types: Cigarettes    Quit date: 04/22/1988    Years since quitting: 32.6   Smokeless tobacco: Never  Vaping Use   Vaping Use: Never used  Substance and Sexual Activity   Alcohol use: No   Drug use: No   Sexual activity: Not on file  Other Topics Concern    Not on file  Social History Narrative   Right handed   Lives alone in retirement community    Social Determinants of Health   Financial Resource Strain: Not on file  Food Insecurity: Not on file  Transportation Needs: Not on file  Physical Activity: Not on file  Stress: Not on file  Social Connections: Not on file  Intimate Partner Violence: Not on file    Current Outpatient Medications on File Prior to Visit  Medication Sig Dispense Refill   amLODipine (NORVASC) 5 MG tablet Take 1 tablet (5 mg total) by mouth daily. 30 tablet 11   aspirin 81 MG tablet Take 81 mg by mouth daily.      atorvastatin (LIPITOR) 40 MG tablet Take 40 mg by mouth daily.     buPROPion (WELLBUTRIN SR) 150 MG 12 hr tablet Take 150 mg by mouth 2 (two) times daily.     Calcium Carb-Cholecalciferol 600-200 MG-UNIT TABS Take 1 tablet by mouth daily.     Carboxymethylcellulose Sodium (THERATEARS) 0.25 % SOLN Place 2 drops into both eyes 2 (two) times daily.     Cinnamon 500 MG TABS Take 500 mg by mouth daily.  cloNIDine (CATAPRES) 0.2 MG tablet Take 0.2 mg by mouth 2 (two) times daily.     donepezil (ARICEPT) 10 MG tablet Take 10 mg by mouth at bedtime.     esomeprazole (NEXIUM) 40 MG capsule Take 40 mg by mouth 2 (two) times daily before a meal.      FLUoxetine (PROZAC) 40 MG capsule Take 40 mg by mouth daily.     levothyroxine (SYNTHROID) 88 MCG tablet Take 88 mcg by mouth daily before breakfast.     Mouthwashes (BIOTENE/CALCIUM PBF) LIQD Take one tablespoon by mouth (8ml) and rinse solution for 30 seconds then spit out (Patient taking differently: 15 mLs by Mouth Rinse route See admin instructions. Rinse daily for 30 seconds, using 15 ml's, then spit out- as directed) 450 mL 0   Multiple Vitamins-Minerals (MULTIVITAMIN WITH MINERALS) tablet Take 1 tablet by mouth daily.     ONE TOUCH ULTRA TEST test strip 1 each by Other route as needed for other.      psyllium (REGULOID) 0.52 G capsule Take 0.52 g by  mouth 3 (three) times daily.     solifenacin (VESICARE) 5 MG tablet Take 5 mg by mouth daily.     sucralfate (CARAFATE) 1 g tablet Take 1 g by mouth 3 (three) times daily as needed (stomach acid before meals).     valsartan (DIOVAN) 160 MG tablet Take 1 tablet (160 mg total) by mouth daily. Please call to schedule appointment for future refills. 90 tablet 0   vitamin E 600 UNIT capsule Take 600 Units by mouth daily.     albuterol (PROVENTIL HFA;VENTOLIN HFA) 108 (90 Base) MCG/ACT inhaler Inhale 2 puffs into the lungs every 6 (six) hours as needed for wheezing or shortness of breath.      fish oil-omega-3 fatty acids 1000 MG capsule Take 1 g by mouth daily.      hydrochlorothiazide (HYDRODIURIL) 25 MG tablet Take 1 tablet (25 mg total) by mouth daily for 30 days. 30 tablet 0   vitamin B-12 (CYANOCOBALAMIN) 100 MCG tablet Take 1,000 mcg by mouth daily.     No current facility-administered medications on file prior to visit.    Allergies  Allergen Reactions   Codeine Other (See Comments)    FEELS  EXTREMELY "LOOPY"   Grass Extracts [Gramineae Pollens] Itching and Other (See Comments)    Runny nose, itchy eyes, allergic symptoms   Mold Extract [Trichophyton] Other (See Comments)    Takes allergy shots for this- sinus infections/congestion    Family History  Problem Relation Age of Onset   Cancer Mother        unsure if gastro intestinal area or uterine   Heart attack Father    Stroke Father    Alzheimer's disease Sister    Heart attack Maternal Grandfather    Heart attack Paternal Grandmother    CAD Son    Breast cancer Neg Hx    Diabetes Neg Hx     BP (!) 160/80 (BP Location: Right Arm, Patient Position: Sitting, Cuff Size: Normal)   Pulse 75   Ht 5\' 1"  (1.549 m)   Wt 141 lb 9.6 oz (64.2 kg)   SpO2 91%   BMI 26.76 kg/m    ROS: denies weight loss, sob, or n/v.  No change in chronic depression or memory loss.      Objective:   Physical Exam Pulses: dorsalis pedis intact  bilat.   MSK: no deformity of the feet CV: 1+ bilat leg edema.  Skin:  no ulcer on the feet.  normal color and temp on the feet.  Neuro: sensation is intact to touch on the feet, but decreased from normal.    Lab Results  Component Value Date   HGBA1C 7.0 (A) 11/29/2020   I have reviewed outside records, and summarized: Pt was noted to have elevated A1c, and referred here.  She saw GI for constip and other GI sxs.       Assessment & Plan:  Type 2 DM: uncontrolled Hypothyroidism: recheck today  Patient Instructions  good diet and exercise significantly improve the control of your diabetes.  please let me know if you wish to be referred to a dietician.  high blood sugar is very risky to your health.  you should see an eye doctor and dentist every year.  It is very important to get all recommended vaccinations.  Controlling your blood pressure and cholesterol drastically reduces the damage diabetes does to your body.  Those who smoke should quit.  Please discuss these with your doctor.  check your blood sugar once a day.  vary the time of day when you check, between before the 3 meals, and at bedtime.  also check if you have symptoms of your blood sugar being too high or too low.  please keep a record of the readings and bring it to your next appointment here (or you can bring the meter itself).  You can write it on any piece of paper.  please call us sooner if your blood sugar goes below 70, or if most of your readings are over 200. We will need to take this complex situation in stages. For now, please: Reduce the metformin, and: I have sent a prescription to your pharmacy, to add "repaglinide." Blood tests are requested for you today.  We'll let you know about the results.  Please come back for a follow-up appointment in 2 months.

## 2020-12-01 DIAGNOSIS — E119 Type 2 diabetes mellitus without complications: Secondary | ICD-10-CM | POA: Insufficient documentation

## 2020-12-01 LAB — THYROID PEROXIDASE ANTIBODY: Thyroperoxidase Ab SerPl-aCnc: <1 IU/mL (ref <9)

## 2020-12-01 LAB — TRAB (TSH RECEPTOR BINDING ANTIBODY): TRAB: <1 IU/L (ref <=2.00)

## 2020-12-04 ENCOUNTER — Other Ambulatory Visit: Payer: Self-pay

## 2020-12-04 ENCOUNTER — Ambulatory Visit
Admission: RE | Admit: 2020-12-04 | Discharge: 2020-12-04 | Disposition: A | Discharge status: Home / Self Care | Payer: Medicare PPO | Source: Ambulatory Visit | Attending: Internal Medicine | Admitting: Internal Medicine

## 2020-12-04 DIAGNOSIS — Z1231 Encounter for screening mammogram for malignant neoplasm of breast: Secondary | ICD-10-CM

## 2020-12-18 DIAGNOSIS — H5203 Hypermetropia, bilateral: Secondary | ICD-10-CM | POA: Diagnosis not present

## 2020-12-18 DIAGNOSIS — H02831 Dermatochalasis of right upper eyelid: Secondary | ICD-10-CM | POA: Diagnosis not present

## 2020-12-18 DIAGNOSIS — H2512 Age-related nuclear cataract, left eye: Secondary | ICD-10-CM | POA: Diagnosis not present

## 2020-12-18 DIAGNOSIS — E119 Type 2 diabetes mellitus without complications: Secondary | ICD-10-CM | POA: Diagnosis not present

## 2021-01-06 ENCOUNTER — Other Ambulatory Visit: Payer: Self-pay | Admitting: Interventional Cardiology

## 2021-01-06 DIAGNOSIS — I10 Essential (primary) hypertension: Secondary | ICD-10-CM

## 2021-01-24 DIAGNOSIS — E039 Hypothyroidism, unspecified: Secondary | ICD-10-CM | POA: Diagnosis not present

## 2021-01-24 DIAGNOSIS — E785 Hyperlipidemia, unspecified: Secondary | ICD-10-CM | POA: Diagnosis not present

## 2021-01-24 DIAGNOSIS — E559 Vitamin D deficiency, unspecified: Secondary | ICD-10-CM | POA: Diagnosis not present

## 2021-01-24 DIAGNOSIS — E1140 Type 2 diabetes mellitus with diabetic neuropathy, unspecified: Secondary | ICD-10-CM | POA: Diagnosis not present

## 2021-01-29 ENCOUNTER — Ambulatory Visit: Payer: Medicare PPO | Admitting: Endocrinology

## 2021-01-29 ENCOUNTER — Other Ambulatory Visit: Payer: Self-pay

## 2021-01-29 ENCOUNTER — Telehealth: Payer: Self-pay | Admitting: Endocrinology

## 2021-01-29 VITALS — BP 170/70 | HR 74 | Ht 61.0 in | Wt 144.6 lb

## 2021-01-29 DIAGNOSIS — E119 Type 2 diabetes mellitus without complications: Secondary | ICD-10-CM

## 2021-01-29 LAB — POCT GLYCOSYLATED HEMOGLOBIN (HGB A1C): Hemoglobin A1C: 7.3 % — AB (ref 4.0–5.6)

## 2021-01-29 NOTE — Progress Notes (Signed)
Subjective:    Patient ID: Haley Sanders, female    DOB: [birth year], 87 y.o.   MRN: [id number]  HPI Pt returns for f/u of diabetes mellitus: DM type: 2 Dx'ed: [id number] Complications: stage 3 CRI and PN Therapy: 2 oral meds GDM: never DKA: never Severe hypoglycemia: never Pancreatitis: never Pancreatic imaging: normal on 2021 CT SDOH: none Other: she has never been on insulin;  She had thyroidect in 2005. Interval history: no cbg record, but states cbg's vary from 139-215.  There is no trend throughout the day.   Past Medical History:  Diagnosis Date   Depression    Diabetes mellitus    GERD (gastroesophageal reflux disease)    Hypercholesterolemia    Hypertension    Hypothyroidism    Memory deficit    Osteoporosis     Past Surgical History:  Procedure Laterality Date   ABDOMINAL HYSTERECTOMY  2001   BIOPSY  05/10/2020   Procedure: BIOPSY;  Surgeon: Clarene Essex, MD;  Location: [facility];  Service: Endoscopy;;   ESOPHAGOGASTRODUODENOSCOPY (EGD) WITH PROPOFOL N/A 05/10/2020   Procedure: ESOPHAGOGASTRODUODENOSCOPY (EGD) WITH PROPOFOL;  Surgeon: Clarene Essex, MD;  Location: [facility];  Service: Endoscopy;  Laterality: N/A;   FOOT SURGERY  1990   right foot   ROTATOR CUFF REPAIR  1991   right   ROTATOR CUFF REPAIR  2001   left side   TONSILLECTOMY  1959   TUBAL LIGATION  1975    Social History   Socioeconomic History   Marital status: Widowed    Spouse name: Not on file   Number of children: Not on file   Years of education: Not on file   Highest education level: Not on file  Occupational History   Not on file  Tobacco Use   Smoking status: Former    Packs/day: 1.00    Years: 33.00    Pack years: 33.00    Types: Cigarettes    Quit date: 04/22/1988    Years since quitting: 32.8   Smokeless tobacco: Never  Vaping Use   Vaping Use: Never used  Substance and Sexual Activity   Alcohol use: No   Drug use: No   Sexual activity: Not on file  Other Topics  Concern   Not on file  Social History Narrative   Right handed   Lives alone in retirement community    Social Determinants of Health   Financial Resource Strain: Not on file  Food Insecurity: Not on file  Transportation Needs: Not on file  Physical Activity: Not on file  Stress: Not on file  Social Connections: Not on file  Intimate Partner Violence: Not on file    Current Outpatient Medications on File Prior to Visit  Medication Sig Dispense Refill   amLODipine (NORVASC) 5 MG tablet TAKE 1 TABLET EVERY DAY 90 tablet 0   aspirin 81 MG tablet Take 81 mg by mouth daily.      atorvastatin (LIPITOR) 40 MG tablet Take 40 mg by mouth daily.     buPROPion (WELLBUTRIN SR) 150 MG 12 hr tablet Take 150 mg by mouth 2 (two) times daily.     Calcium Carb-Cholecalciferol 600-200 MG-UNIT TABS Take 1 tablet by mouth daily.     Carboxymethylcellulose Sodium (THERATEARS) 0.25 % SOLN Place 2 drops into both eyes 2 (two) times daily.     Cinnamon 500 MG TABS Take 500 mg by mouth daily.     cloNIDine (CATAPRES) 0.2 MG tablet Take 0.2 mg by mouth  2 (two) times daily.     donepezil (ARICEPT) 10 MG tablet Take 10 mg by mouth at bedtime.     esomeprazole (NEXIUM) 40 MG capsule Take 40 mg by mouth 2 (two) times daily before a meal.      FLUoxetine (PROZAC) 40 MG capsule Take 40 mg by mouth daily.     levothyroxine (SYNTHROID) 88 MCG tablet Take 88 mcg by mouth daily before breakfast.     metFORMIN (GLUCOPHAGE-XR) 500 MG 24 hr tablet Take 1 tablet (500 mg total) by mouth daily. 90 tablet 3   Mouthwashes (BIOTENE/CALCIUM PBF) LIQD Take one tablespoon by mouth (75ml) and rinse solution for 30 seconds then spit out (Patient taking differently: 15 mLs by Mouth Rinse route See admin instructions. Rinse daily for 30 seconds, using 15 ml's, then spit out- as directed) 450 mL 0   Multiple Vitamins-Minerals (MULTIVITAMIN WITH MINERALS) tablet Take 1 tablet by mouth daily.     ONE TOUCH ULTRA TEST test strip 1 each by  Other route as needed for other.      psyllium (REGULOID) 0.52 G capsule Take 0.52 g by mouth 3 (three) times daily.     repaglinide (PRANDIN) 0.5 MG tablet Take 0.5 tablets (0.25 mg total) by mouth 2 (two) times daily before a meal. 90 tablet 11   solifenacin (VESICARE) 5 MG tablet Take 5 mg by mouth daily.     sucralfate (CARAFATE) 1 g tablet Take 1 g by mouth 3 (three) times daily as needed (stomach acid before meals).     valsartan (DIOVAN) 160 MG tablet Take 1 tablet (160 mg total) by mouth daily. Please call to schedule appointment for future refills. 90 tablet 0   vitamin E 600 UNIT capsule Take 600 Units by mouth daily.     albuterol (PROVENTIL HFA;VENTOLIN HFA) 108 (90 Base) MCG/ACT inhaler Inhale 2 puffs into the lungs every 6 (six) hours as needed for wheezing or shortness of breath.      fish oil-omega-3 fatty acids 1000 MG capsule Take 1 g by mouth daily.      hydrochlorothiazide (HYDRODIURIL) 25 MG tablet Take 1 tablet (25 mg total) by mouth daily for 30 days. 30 tablet 0   vitamin B-12 (CYANOCOBALAMIN) 100 MCG tablet Take 1,000 mcg by mouth daily.     No current facility-administered medications on file prior to visit.    Allergies  Allergen Reactions   Codeine Other (See Comments)    FEELS  EXTREMELY "LOOPY"   Grass Extracts [Gramineae Pollens] Itching and Other (See Comments)    Runny nose, itchy eyes, allergic symptoms   Mold Extract [Trichophyton] Other (See Comments)    Takes allergy shots for this- sinus infections/congestion    Family History  Problem Relation Age of Onset   Cancer Mother        unsure if gastro intestinal area or uterine   Heart attack Father    Stroke Father    Alzheimer's disease Sister    Heart attack Maternal Grandfather    Heart attack Paternal Grandmother    CAD Son    Breast cancer Neg Hx    Diabetes Neg Hx     BP (!) 170/70 (BP Location: Right Arm, Patient Position: Sitting, Cuff Size: Normal)   Pulse 74   Ht 5\' 1"  (1.549 m)    Wt 144 lb 9.6 oz (65.6 kg)   SpO2 95%   BMI 27.32 kg/m   Review of Systems She denies hypoglycemia.  Objective:   Physical Exam  Lab Results  Component Value Date   TSH 2.40 11/29/2020   Lab Results  Component Value Date   HGBA1C 7.3 (A) 01/29/2021   Lab Results  Component Value Date   CREATININE 0.97 11/29/2020   BUN 16 11/29/2020   NA 138 11/29/2020   K 3.8 11/29/2020   CL 100 11/29/2020   CO2 30 11/29/2020      Assessment & Plan:  Type 2 DM: well-controlled  Patient Instructions  Your blood pressure is high today.  Please see your primary care provider soon, to have it rechecked check your blood sugar once a day.  vary the time of day when you check, between before the 3 meals, and at bedtime.  also check if you have symptoms of your blood sugar being too high or too low.  please keep a record of the readings and bring it to your next appointment here (or you can bring the meter itself).  You can write it on any piece of paper.  please call us sooner if your blood sugar goes below 70, or if most of your readings are over 200.   Please continue the same 2 diabetes medications.   Please come back for a follow-up appointment in 4 months.

## 2021-01-29 NOTE — Telephone Encounter (Signed)
At time of appt pt asked how she can lower AC and inquiring about baso levels. Also, pt reiterated back to her having high blood sugars in the 200s and non of those questions were answered.   Please call pt or mychart and advise her on her concerns.   Pt contact [phone]

## 2021-01-29 NOTE — Patient Instructions (Addendum)
Your blood pressure is high today.  Please see your primary care provider soon, to have it rechecked check your blood sugar once a day.  vary the time of day when you check, between before the 3 meals, and at bedtime.  also check if you have symptoms of your blood sugar being too high or too low.  please keep a record of the readings and bring it to your next appointment here (or you can bring the meter itself).  You can write it on any piece of paper.  please call us sooner if your blood sugar goes below 70, or if most of your readings are over 200.   Please continue the same 2 diabetes medications.   Please come back for a follow-up appointment in 4 months.

## 2021-01-30 NOTE — Telephone Encounter (Signed)
Message sent thru MyChart 

## 2021-01-31 DIAGNOSIS — I5030 Unspecified diastolic (congestive) heart failure: Secondary | ICD-10-CM | POA: Diagnosis not present

## 2021-01-31 DIAGNOSIS — E1140 Type 2 diabetes mellitus with diabetic neuropathy, unspecified: Secondary | ICD-10-CM | POA: Diagnosis not present

## 2021-01-31 DIAGNOSIS — E039 Hypothyroidism, unspecified: Secondary | ICD-10-CM | POA: Diagnosis not present

## 2021-01-31 DIAGNOSIS — F0390 Unspecified dementia without behavioral disturbance: Secondary | ICD-10-CM | POA: Diagnosis not present

## 2021-01-31 DIAGNOSIS — M8580 Other specified disorders of bone density and structure, unspecified site: Secondary | ICD-10-CM | POA: Diagnosis not present

## 2021-01-31 DIAGNOSIS — J449 Chronic obstructive pulmonary disease, unspecified: Secondary | ICD-10-CM | POA: Diagnosis not present

## 2021-01-31 DIAGNOSIS — N1831 Chronic kidney disease, stage 3a: Secondary | ICD-10-CM | POA: Diagnosis not present

## 2021-01-31 DIAGNOSIS — Z0000 Encounter for general adult medical examination without abnormal findings: Secondary | ICD-10-CM | POA: Diagnosis not present

## 2021-01-31 DIAGNOSIS — R82998 Other abnormal findings in urine: Secondary | ICD-10-CM | POA: Diagnosis not present

## 2021-01-31 DIAGNOSIS — I129 Hypertensive chronic kidney disease with stage 1 through stage 4 chronic kidney disease, or unspecified chronic kidney disease: Secondary | ICD-10-CM | POA: Diagnosis not present

## 2021-02-16 ENCOUNTER — Other Ambulatory Visit: Payer: Self-pay | Admitting: Interventional Cardiology

## 2021-03-13 DIAGNOSIS — N3946 Mixed incontinence: Secondary | ICD-10-CM | POA: Diagnosis not present

## 2021-03-13 DIAGNOSIS — R350 Frequency of micturition: Secondary | ICD-10-CM | POA: Diagnosis not present

## 2021-03-14 DIAGNOSIS — R350 Frequency of micturition: Secondary | ICD-10-CM | POA: Diagnosis not present

## 2021-03-22 DIAGNOSIS — F32A Depression, unspecified: Secondary | ICD-10-CM | POA: Diagnosis not present

## 2021-03-22 DIAGNOSIS — F0283 Dementia in other diseases classified elsewhere, unspecified severity, with mood disturbance: Secondary | ICD-10-CM | POA: Diagnosis not present

## 2021-03-22 DIAGNOSIS — G309 Alzheimer's disease, unspecified: Secondary | ICD-10-CM | POA: Diagnosis not present

## 2021-03-22 DIAGNOSIS — K219 Gastro-esophageal reflux disease without esophagitis: Secondary | ICD-10-CM | POA: Diagnosis not present

## 2021-03-22 DIAGNOSIS — E1151 Type 2 diabetes mellitus with diabetic peripheral angiopathy without gangrene: Secondary | ICD-10-CM | POA: Diagnosis not present

## 2021-03-22 DIAGNOSIS — M1990 Unspecified osteoarthritis, unspecified site: Secondary | ICD-10-CM | POA: Diagnosis not present

## 2021-03-22 DIAGNOSIS — I10 Essential (primary) hypertension: Secondary | ICD-10-CM | POA: Diagnosis not present

## 2021-03-22 DIAGNOSIS — E785 Hyperlipidemia, unspecified: Secondary | ICD-10-CM | POA: Diagnosis not present

## 2021-03-22 DIAGNOSIS — E039 Hypothyroidism, unspecified: Secondary | ICD-10-CM | POA: Diagnosis not present

## 2021-03-27 ENCOUNTER — Other Ambulatory Visit: Payer: Self-pay

## 2021-03-27 ENCOUNTER — Ambulatory Visit: Payer: Medicare PPO | Admitting: Interventional Cardiology

## 2021-03-27 ENCOUNTER — Encounter: Payer: Self-pay | Admitting: Interventional Cardiology

## 2021-03-27 VITALS — BP 140/72 | HR 58 | Ht 61.0 in | Wt 150.0 lb

## 2021-03-27 DIAGNOSIS — I421 Obstructive hypertrophic cardiomyopathy: Secondary | ICD-10-CM | POA: Diagnosis not present

## 2021-03-27 DIAGNOSIS — I110 Hypertensive heart disease with heart failure: Secondary | ICD-10-CM | POA: Diagnosis not present

## 2021-03-27 DIAGNOSIS — I5032 Chronic diastolic (congestive) heart failure: Secondary | ICD-10-CM | POA: Diagnosis not present

## 2021-03-27 DIAGNOSIS — M25472 Effusion, left ankle: Secondary | ICD-10-CM

## 2021-03-27 DIAGNOSIS — M25471 Effusion, right ankle: Secondary | ICD-10-CM

## 2021-03-27 DIAGNOSIS — E119 Type 2 diabetes mellitus without complications: Secondary | ICD-10-CM | POA: Diagnosis not present

## 2021-03-27 MED ORDER — AMLODIPINE BESYLATE 5 MG PO TABS: 5.0000 mg | ORAL_TABLET | Freq: Two times a day (BID) | ORAL | 3 refills | Status: DC

## 2021-03-27 NOTE — Progress Notes (Signed)
Cardiology Office Note   Date:  03/27/2021   ID:  Haley Sanders, DOB Dec 13, 1932, MRN [id number]  PCP:  Haley Greathouse, MD    Chief Complaint  Patient presents with   Follow-up    Has questions about hctz     Wt Readings from Last 3 Encounters:  03/27/21 150 lb (68 kg)  01/29/21 144 lb 9.6 oz (65.6 kg)  11/29/20 141 lb 9.6 oz (64.2 kg)       History of Present Illness: Haley Sanders is a 88 y.o. female  with Who has a h/o chronic diastolic heart failure.  She had normal LV function.     She was on oxygen for a while as well.  She was taken off of oxygen since then.    In Jan 2020, "she had an elevated BP reading.  She had a change made to her BP meds.  BP has been better.   In the past, it was noted: "She has had some ankle edema.  Swelling worse at the end of the day. "   Swelling in ankles has persisted.  L>R. "  In 2021: "BP at home was in the 170s systolic.  Clonidine was added by Dr. Felipa Eth,  and it was better. "   Still has some episodes of high BP, up to 170s.  Usually in the 130-140 one hour after medicines.  A few hours after that, blood pressure tends to be in the 160 range.   Uses a stationary bike.  She feels better with exercise.  No chest pain with activity.    Denies : exertional Chest pain. Dizziness. Nitroglycerin use. Orthopnea. Palpitations. Paroxysmal nocturnal dyspnea. Shortness of breath. Syncope.    Has some fleeting, left-sided back pain, not related to exertion.   Has had some colon issues.  Not a candidate for colonoscopy per her report.     Past Medical History:  Diagnosis Date   Depression    Diabetes mellitus    GERD (gastroesophageal reflux disease)    Hypercholesterolemia    Hypertension    Hypothyroidism    Memory deficit    Osteoporosis     Past Surgical History:  Procedure Laterality Date   ABDOMINAL HYSTERECTOMY  2001   BIOPSY  05/10/2020   Procedure: BIOPSY;  Surgeon: Haley Rigger, MD;  Location: [facility];   Service: Endoscopy;;   ESOPHAGOGASTRODUODENOSCOPY (EGD) WITH PROPOFOL N/A 05/10/2020   Procedure: ESOPHAGOGASTRODUODENOSCOPY (EGD) WITH PROPOFOL;  Surgeon: Haley Rigger, MD;  Location: [facility];  Service: Endoscopy;  Laterality: N/A;   FOOT SURGERY  1990   right foot   ROTATOR CUFF REPAIR  1991   right   ROTATOR CUFF REPAIR  2001   left side   TONSILLECTOMY  1959   TUBAL LIGATION  1975     Current Outpatient Medications  Medication Sig Dispense Refill   amLODipine (NORVASC) 5 MG tablet TAKE 1 TABLET EVERY DAY 90 tablet 0   aspirin 81 MG tablet Take 81 mg by mouth daily.      atorvastatin (LIPITOR) 40 MG tablet Take 40 mg by mouth daily.     buPROPion (WELLBUTRIN SR) 150 MG 12 hr tablet Take 150 mg by mouth 2 (two) times daily.     Calcium Carb-Cholecalciferol 600-200 MG-UNIT TABS Take 1 tablet by mouth daily.     Carboxymethylcellulose Sodium (THERATEARS) 0.25 % SOLN Place 2 drops into both eyes 2 (two) times daily.     cloNIDine (CATAPRES) 0.2 MG tablet Take  0.2 mg by mouth 2 (two) times daily.     donepezil (ARICEPT) 10 MG tablet Take 10 mg by mouth at bedtime.     esomeprazole (NEXIUM) 40 MG capsule Take 40 mg by mouth 2 (two) times daily before a meal.      FLUoxetine (PROZAC) 40 MG capsule Take 40 mg by mouth daily.     levothyroxine (SYNTHROID) 88 MCG tablet Take 88 mcg by mouth daily before breakfast.     metFORMIN (GLUCOPHAGE-XR) 500 MG 24 hr tablet Take 1 tablet (500 mg total) by mouth daily. 90 tablet 3   Mouthwashes (BIOTENE/CALCIUM PBF) LIQD Take one tablespoon by mouth (76ml) and rinse solution for 30 seconds then spit out (Patient taking differently: 15 mLs by Mouth Rinse route See admin instructions. Rinse daily for 30 seconds, using 15 ml's, then spit out- as directed) 450 mL 0   Multiple Vitamins-Minerals (MULTIVITAMIN WITH MINERALS) tablet Take 1 tablet by mouth daily.     ONE TOUCH ULTRA TEST test strip 1 each by Other route as needed for other.      psyllium  (REGULOID) 0.52 G capsule Take 0.52 g by mouth 3 (three) times daily.     repaglinide (PRANDIN) 0.5 MG tablet Take 0.5 tablets (0.25 mg total) by mouth 2 (two) times daily before a meal. 90 tablet 11   solifenacin (VESICARE) 5 MG tablet Take 5 mg by mouth daily.     sucralfate (CARAFATE) 1 g tablet Take 1 g by mouth 3 (three) times daily as needed (stomach acid before meals).     valsartan (DIOVAN) 160 MG tablet Take 1 tablet (160 mg total) by mouth daily. Please keep upcoming appt in January 2023 with Dr. Eldridge Dace before anymore refills. Thank you 90 tablet 0   hydrochlorothiazide (HYDRODIURIL) 25 MG tablet Take 1 tablet (25 mg total) by mouth daily for 30 days. 30 tablet 0   No current facility-administered medications for this visit.    Allergies:   Codeine, Grass extracts [gramineae pollens], and Mold extract [trichophyton]    Social History:  The patient  reports that she quit smoking about 32 years ago. Her smoking use included cigarettes. She has a 33.00 pack-year smoking history. She has never used smokeless tobacco. She reports that she does not drink alcohol and does not use drugs.   Family History:  The patient's family history includes Alzheimer's disease in her sister; CAD in her son; Cancer in her mother; Heart attack in her father, maternal grandfather, and paternal grandmother; Stroke in her father.    ROS:  Please see the history of present illness.   Otherwise, review of systems are positive for GI issues- fatigue.   All other systems are reviewed and negative.    PHYSICAL EXAM: VS:  BP 140/72    Pulse (!) 58    Ht 5\' 1"  (1.549 m)    Wt 150 lb (68 kg)    SpO2 99%    BMI 28.34 kg/m  , BMI Body mass index is 28.34 kg/m. GEN: Well nourished, well developed, in no acute distress, frail, elderly HEENT: normal Neck: no JVD, carotid bruits, or masses Cardiac: RRR; no murmurs, rubs, or gallops,no edema  Respiratory:  clear to auscultation bilaterally, normal work of  breathing GI: soft, nontender, nondistended, + BS MS: no deformity or atrophy Skin: warm and dry, no rash Neuro:  Strength and sensation are intact Psych: euthymic mood, full affect   EKG:   The ekg ordered today demonstrates NSR,  Recent Labs: 11/29/2020: BUN 16; Creatinine, Ser 0.97; Potassium 3.8; Sodium 138; TSH 2.40   Lipid Panel No results found for: CHOL, TRIG, HDL, CHOLHDL, VLDL, LDLCALC, LDLDIRECT   Other studies Reviewed: Additional studies/ records that were reviewed today with results demonstrating: Labs reviewed: A1c 7.3, LDL 101, HDL 64 in November 2022.   ASSESSMENT AND PLAN:  Chronic diastolic heart failure: Appears euvolemic.  Continue attempts at blood pressure control.  I encouraged her to try to exercise more regularly.  She does have access to exercise equipment. HOCM: plan for echo.  LVOT gradient was low in 2017.  Need additional blood pressure lowering therapy and rate slowing drugs or not an option. Hypertensive heart disease: Increase amlodipine to 5 mg twice daily.  Based on the fact that her blood pressure is controlled an hour after her medicines, will try to spread out the medicines through the day to maintain more even control. DM: A1C 7.3.  Whole food, plant-based diet. Ankle edema: Elevate legs.   Current medicines are reviewed at length with the patient today.  The patient concerns regarding her medicines were addressed.  The following changes have been made:  No change  Labs/ tests ordered today include:  No orders of the defined types were placed in this encounter.   Recommend 150 minutes/week of aerobic exercise Low fat, low carb, high fiber diet recommended  Disposition:   FU with me in 1 year; I asked her to keep track of her blood pressures at home after she has rested for about 10 minutes.  She will follow-up with her primary care doctor.  If blood pressure becomes more of an issue, she can certainly follow-up in our hypertension  clinic.   Signed, Lance MussJayadeep Elenore Wanninger, MD  03/27/2021 9:34 AM    [facility] [address], [place], [place]  [address] Phone: [phone]; Fax: [phone]

## 2021-03-27 NOTE — Patient Instructions (Signed)
Medication Instructions:  Your physician has recommended you make the following change in your medication: Increase amlodipine to 5 mg by mouth twice daily  *If you need a refill on your cardiac medications before your next appointment, please call your pharmacy*   Lab Work: none If you have labs (blood work) drawn today and your tests are completely normal, you will receive your results only by: MyChart Message (if you have MyChart) OR A paper copy in the mail If you have any lab test that is abnormal or we need to change your treatment, we will call you to review the results.   Testing/Procedures: Your physician has requested that you have an echocardiogram. Echocardiography is a painless test that uses sound waves to create images of your heart. It provides your doctor with information about the size and shape of your heart and how well your hearts chambers and valves are working. This procedure takes approximately one hour. There are no restrictions for this procedure.    Follow-Up: At [facility], you and your health needs are our priority.  As part of our continuing mission to provide you with exceptional heart care, we have created designated Provider Care Teams.  These Care Teams include your primary Cardiologist (physician) and Advanced Practice Providers (APPs -  Physician Assistants and Nurse Practitioners) who all work together to provide you with the care you need, when you need it.  We recommend signing up for the patient portal called "MyChart".  Sign up information is provided on this After Visit Summary.  MyChart is used to connect with patients for Virtual Visits (Telemedicine).  Patients are able to view lab/test results, encounter notes, upcoming appointments, etc.  Non-urgent messages can be sent to your provider as well.   To learn more about what you can do with MyChart, go to [email or web address].    Your next appointment:   12 month(s)  The format for your  next appointment:   In Person  Provider:   Lance Muss, MD     Other Instructions

## 2021-04-16 ENCOUNTER — Ambulatory Visit (HOSPITAL_COMMUNITY): Payer: Medicare PPO

## 2021-04-19 DIAGNOSIS — F321 Major depressive disorder, single episode, moderate: Secondary | ICD-10-CM | POA: Diagnosis not present

## 2021-04-23 ENCOUNTER — Other Ambulatory Visit: Payer: Self-pay

## 2021-04-23 DIAGNOSIS — E119 Type 2 diabetes mellitus without complications: Secondary | ICD-10-CM

## 2021-04-23 MED ORDER — REPAGLINIDE 0.5 MG PO TABS: 0.2500 mg | ORAL_TABLET | Freq: Two times a day (BID) | ORAL | 11 refills | Status: AC

## 2021-04-30 ENCOUNTER — Other Ambulatory Visit: Payer: Self-pay

## 2021-04-30 ENCOUNTER — Ambulatory Visit (HOSPITAL_COMMUNITY): Payer: Medicare PPO | Attending: Interventional Cardiology

## 2021-04-30 DIAGNOSIS — I110 Hypertensive heart disease with heart failure: Secondary | ICD-10-CM | POA: Insufficient documentation

## 2021-04-30 DIAGNOSIS — I5032 Chronic diastolic (congestive) heart failure: Secondary | ICD-10-CM | POA: Diagnosis not present

## 2021-04-30 DIAGNOSIS — I421 Obstructive hypertrophic cardiomyopathy: Secondary | ICD-10-CM | POA: Diagnosis not present

## 2021-04-30 LAB — ECHOCARDIOGRAM COMPLETE
Area-P 1/2: 4.04 cm2
S' Lateral: 2.4 cm

## 2021-05-01 DIAGNOSIS — M792 Neuralgia and neuritis, unspecified: Secondary | ICD-10-CM | POA: Diagnosis not present

## 2021-05-01 DIAGNOSIS — I739 Peripheral vascular disease, unspecified: Secondary | ICD-10-CM | POA: Diagnosis not present

## 2021-05-01 DIAGNOSIS — B351 Tinea unguium: Secondary | ICD-10-CM | POA: Diagnosis not present

## 2021-05-01 DIAGNOSIS — L603 Nail dystrophy: Secondary | ICD-10-CM | POA: Diagnosis not present

## 2021-05-24 ENCOUNTER — Telehealth: Payer: Self-pay | Admitting: Endocrinology

## 2021-05-24 NOTE — Telephone Encounter (Signed)
Patient states her PCP is now managing Patient's care. Patient states she does not want to reschedule appointment. ?

## 2021-05-29 ENCOUNTER — Ambulatory Visit: Payer: Medicare PPO | Admitting: Endocrinology

## 2021-06-06 DIAGNOSIS — I129 Hypertensive chronic kidney disease with stage 1 through stage 4 chronic kidney disease, or unspecified chronic kidney disease: Secondary | ICD-10-CM | POA: Diagnosis not present

## 2021-06-06 DIAGNOSIS — E1140 Type 2 diabetes mellitus with diabetic neuropathy, unspecified: Secondary | ICD-10-CM | POA: Diagnosis not present

## 2021-06-06 DIAGNOSIS — I5030 Unspecified diastolic (congestive) heart failure: Secondary | ICD-10-CM | POA: Diagnosis not present

## 2021-06-06 DIAGNOSIS — N1831 Chronic kidney disease, stage 3a: Secondary | ICD-10-CM | POA: Diagnosis not present

## 2021-06-06 DIAGNOSIS — E039 Hypothyroidism, unspecified: Secondary | ICD-10-CM | POA: Diagnosis not present

## 2021-06-06 DIAGNOSIS — M8580 Other specified disorders of bone density and structure, unspecified site: Secondary | ICD-10-CM | POA: Diagnosis not present

## 2021-06-06 DIAGNOSIS — J449 Chronic obstructive pulmonary disease, unspecified: Secondary | ICD-10-CM | POA: Diagnosis not present

## 2021-06-06 DIAGNOSIS — F39 Unspecified mood [affective] disorder: Secondary | ICD-10-CM | POA: Diagnosis not present

## 2021-06-06 DIAGNOSIS — F0390 Unspecified dementia without behavioral disturbance: Secondary | ICD-10-CM | POA: Diagnosis not present

## 2021-07-07 ENCOUNTER — Other Ambulatory Visit: Payer: Self-pay | Admitting: Interventional Cardiology

## 2021-09-26 DIAGNOSIS — E1140 Type 2 diabetes mellitus with diabetic neuropathy, unspecified: Secondary | ICD-10-CM | POA: Diagnosis not present

## 2021-09-26 DIAGNOSIS — J449 Chronic obstructive pulmonary disease, unspecified: Secondary | ICD-10-CM | POA: Diagnosis not present

## 2021-09-26 DIAGNOSIS — K5900 Constipation, unspecified: Secondary | ICD-10-CM | POA: Diagnosis not present

## 2021-09-26 DIAGNOSIS — F0390 Unspecified dementia without behavioral disturbance: Secondary | ICD-10-CM | POA: Diagnosis not present

## 2021-09-26 DIAGNOSIS — I129 Hypertensive chronic kidney disease with stage 1 through stage 4 chronic kidney disease, or unspecified chronic kidney disease: Secondary | ICD-10-CM | POA: Diagnosis not present

## 2021-09-26 DIAGNOSIS — E039 Hypothyroidism, unspecified: Secondary | ICD-10-CM | POA: Diagnosis not present

## 2021-09-26 DIAGNOSIS — M8580 Other specified disorders of bone density and structure, unspecified site: Secondary | ICD-10-CM | POA: Diagnosis not present

## 2021-09-26 DIAGNOSIS — I5030 Unspecified diastolic (congestive) heart failure: Secondary | ICD-10-CM | POA: Diagnosis not present

## 2021-09-26 DIAGNOSIS — N1831 Chronic kidney disease, stage 3a: Secondary | ICD-10-CM | POA: Diagnosis not present

## 2021-10-15 DIAGNOSIS — E1142 Type 2 diabetes mellitus with diabetic polyneuropathy: Secondary | ICD-10-CM | POA: Diagnosis not present

## 2021-10-15 DIAGNOSIS — R32 Unspecified urinary incontinence: Secondary | ICD-10-CM | POA: Diagnosis not present

## 2021-10-15 DIAGNOSIS — I10 Essential (primary) hypertension: Secondary | ICD-10-CM | POA: Diagnosis not present

## 2021-10-15 DIAGNOSIS — M1990 Unspecified osteoarthritis, unspecified site: Secondary | ICD-10-CM | POA: Diagnosis not present

## 2021-10-15 DIAGNOSIS — K5900 Constipation, unspecified: Secondary | ICD-10-CM | POA: Diagnosis not present

## 2021-10-15 DIAGNOSIS — E039 Hypothyroidism, unspecified: Secondary | ICD-10-CM | POA: Diagnosis not present

## 2021-10-15 DIAGNOSIS — K219 Gastro-esophageal reflux disease without esophagitis: Secondary | ICD-10-CM | POA: Diagnosis not present

## 2021-10-15 DIAGNOSIS — E785 Hyperlipidemia, unspecified: Secondary | ICD-10-CM | POA: Diagnosis not present

## 2021-10-15 DIAGNOSIS — F324 Major depressive disorder, single episode, in partial remission: Secondary | ICD-10-CM | POA: Diagnosis not present

## 2021-12-09 DIAGNOSIS — S46011A Strain of muscle(s) and tendon(s) of the rotator cuff of right shoulder, initial encounter: Secondary | ICD-10-CM | POA: Diagnosis not present

## 2021-12-09 DIAGNOSIS — M654 Radial styloid tenosynovitis [de Quervain]: Secondary | ICD-10-CM | POA: Diagnosis not present

## 2021-12-18 DIAGNOSIS — K219 Gastro-esophageal reflux disease without esophagitis: Secondary | ICD-10-CM | POA: Diagnosis not present

## 2021-12-18 DIAGNOSIS — K5904 Chronic idiopathic constipation: Secondary | ICD-10-CM | POA: Diagnosis not present

## 2021-12-24 DIAGNOSIS — H25812 Combined forms of age-related cataract, left eye: Secondary | ICD-10-CM | POA: Diagnosis not present

## 2021-12-24 DIAGNOSIS — H5203 Hypermetropia, bilateral: Secondary | ICD-10-CM | POA: Diagnosis not present

## 2021-12-24 DIAGNOSIS — E119 Type 2 diabetes mellitus without complications: Secondary | ICD-10-CM | POA: Diagnosis not present

## 2022-01-10 ENCOUNTER — Other Ambulatory Visit: Payer: Self-pay | Admitting: Internal Medicine

## 2022-01-10 DIAGNOSIS — Z1231 Encounter for screening mammogram for malignant neoplasm of breast: Secondary | ICD-10-CM

## 2022-01-20 DIAGNOSIS — G609 Hereditary and idiopathic neuropathy, unspecified: Secondary | ICD-10-CM | POA: Diagnosis not present

## 2022-01-20 DIAGNOSIS — M79675 Pain in left toe(s): Secondary | ICD-10-CM | POA: Diagnosis not present

## 2022-01-20 DIAGNOSIS — B351 Tinea unguium: Secondary | ICD-10-CM | POA: Diagnosis not present

## 2022-01-20 DIAGNOSIS — M2041 Other hammer toe(s) (acquired), right foot: Secondary | ICD-10-CM | POA: Diagnosis not present

## 2022-01-20 DIAGNOSIS — E119 Type 2 diabetes mellitus without complications: Secondary | ICD-10-CM | POA: Diagnosis not present

## 2022-02-09 ENCOUNTER — Other Ambulatory Visit: Payer: Self-pay | Admitting: Interventional Cardiology

## 2022-02-09 DIAGNOSIS — I110 Hypertensive heart disease with heart failure: Secondary | ICD-10-CM

## 2022-02-11 DIAGNOSIS — E039 Hypothyroidism, unspecified: Secondary | ICD-10-CM | POA: Diagnosis not present

## 2022-02-11 DIAGNOSIS — E1140 Type 2 diabetes mellitus with diabetic neuropathy, unspecified: Secondary | ICD-10-CM | POA: Diagnosis not present

## 2022-02-11 DIAGNOSIS — E785 Hyperlipidemia, unspecified: Secondary | ICD-10-CM | POA: Diagnosis not present

## 2022-02-11 DIAGNOSIS — R7989 Other specified abnormal findings of blood chemistry: Secondary | ICD-10-CM | POA: Diagnosis not present

## 2022-02-11 DIAGNOSIS — E559 Vitamin D deficiency, unspecified: Secondary | ICD-10-CM | POA: Diagnosis not present

## 2022-02-18 DIAGNOSIS — E1140 Type 2 diabetes mellitus with diabetic neuropathy, unspecified: Secondary | ICD-10-CM | POA: Diagnosis not present

## 2022-02-18 DIAGNOSIS — K5900 Constipation, unspecified: Secondary | ICD-10-CM | POA: Diagnosis not present

## 2022-02-18 DIAGNOSIS — D649 Anemia, unspecified: Secondary | ICD-10-CM | POA: Diagnosis not present

## 2022-02-18 DIAGNOSIS — R1312 Dysphagia, oropharyngeal phase: Secondary | ICD-10-CM | POA: Diagnosis not present

## 2022-02-18 DIAGNOSIS — I5030 Unspecified diastolic (congestive) heart failure: Secondary | ICD-10-CM | POA: Diagnosis not present

## 2022-02-18 DIAGNOSIS — I129 Hypertensive chronic kidney disease with stage 1 through stage 4 chronic kidney disease, or unspecified chronic kidney disease: Secondary | ICD-10-CM | POA: Diagnosis not present

## 2022-02-18 DIAGNOSIS — N1831 Chronic kidney disease, stage 3a: Secondary | ICD-10-CM | POA: Diagnosis not present

## 2022-02-18 DIAGNOSIS — Z0000 Encounter for general adult medical examination without abnormal findings: Secondary | ICD-10-CM | POA: Diagnosis not present

## 2022-02-18 DIAGNOSIS — R634 Abnormal weight loss: Secondary | ICD-10-CM | POA: Diagnosis not present

## 2022-03-04 ENCOUNTER — Ambulatory Visit
Admission: RE | Admit: 2022-03-04 | Discharge: 2022-03-04 | Disposition: A | Discharge status: Home / Self Care | Payer: Medicare PPO | Source: Ambulatory Visit | Attending: Internal Medicine | Admitting: Internal Medicine

## 2022-03-04 DIAGNOSIS — Z1231 Encounter for screening mammogram for malignant neoplasm of breast: Secondary | ICD-10-CM | POA: Diagnosis not present

## 2022-04-14 DIAGNOSIS — R1312 Dysphagia, oropharyngeal phase: Secondary | ICD-10-CM | POA: Diagnosis not present

## 2022-04-14 DIAGNOSIS — K219 Gastro-esophageal reflux disease without esophagitis: Secondary | ICD-10-CM | POA: Diagnosis not present

## 2022-04-14 DIAGNOSIS — R630 Anorexia: Secondary | ICD-10-CM | POA: Diagnosis not present

## 2022-04-14 DIAGNOSIS — K5909 Other constipation: Secondary | ICD-10-CM | POA: Diagnosis not present

## 2022-04-14 DIAGNOSIS — K644 Residual hemorrhoidal skin tags: Secondary | ICD-10-CM | POA: Diagnosis not present

## 2022-04-25 ENCOUNTER — Other Ambulatory Visit: Payer: Self-pay

## 2022-04-25 ENCOUNTER — Other Ambulatory Visit: Payer: Self-pay | Admitting: Interventional Cardiology

## 2022-04-25 DIAGNOSIS — E119 Type 2 diabetes mellitus without complications: Secondary | ICD-10-CM

## 2022-05-01 DIAGNOSIS — G4733 Obstructive sleep apnea (adult) (pediatric): Secondary | ICD-10-CM | POA: Diagnosis not present

## 2022-05-01 DIAGNOSIS — J449 Chronic obstructive pulmonary disease, unspecified: Secondary | ICD-10-CM | POA: Diagnosis not present

## 2022-05-09 DIAGNOSIS — E1140 Type 2 diabetes mellitus with diabetic neuropathy, unspecified: Secondary | ICD-10-CM | POA: Diagnosis not present

## 2022-05-09 DIAGNOSIS — F0390 Unspecified dementia without behavioral disturbance: Secondary | ICD-10-CM | POA: Diagnosis not present

## 2022-05-09 DIAGNOSIS — E039 Hypothyroidism, unspecified: Secondary | ICD-10-CM | POA: Diagnosis not present

## 2022-05-09 DIAGNOSIS — I129 Hypertensive chronic kidney disease with stage 1 through stage 4 chronic kidney disease, or unspecified chronic kidney disease: Secondary | ICD-10-CM | POA: Diagnosis not present

## 2022-05-09 DIAGNOSIS — R531 Weakness: Secondary | ICD-10-CM | POA: Diagnosis not present

## 2022-05-09 DIAGNOSIS — K5900 Constipation, unspecified: Secondary | ICD-10-CM | POA: Diagnosis not present

## 2022-05-09 DIAGNOSIS — N1831 Chronic kidney disease, stage 3a: Secondary | ICD-10-CM | POA: Diagnosis not present

## 2022-05-09 DIAGNOSIS — J449 Chronic obstructive pulmonary disease, unspecified: Secondary | ICD-10-CM | POA: Diagnosis not present

## 2022-05-09 DIAGNOSIS — I5030 Unspecified diastolic (congestive) heart failure: Secondary | ICD-10-CM | POA: Diagnosis not present

## 2022-05-13 DIAGNOSIS — G609 Hereditary and idiopathic neuropathy, unspecified: Secondary | ICD-10-CM | POA: Diagnosis not present

## 2022-05-13 DIAGNOSIS — E119 Type 2 diabetes mellitus without complications: Secondary | ICD-10-CM | POA: Diagnosis not present

## 2022-05-13 DIAGNOSIS — M2041 Other hammer toe(s) (acquired), right foot: Secondary | ICD-10-CM | POA: Diagnosis not present

## 2022-05-13 DIAGNOSIS — M79675 Pain in left toe(s): Secondary | ICD-10-CM | POA: Diagnosis not present

## 2022-05-13 DIAGNOSIS — B351 Tinea unguium: Secondary | ICD-10-CM | POA: Diagnosis not present

## 2022-06-17 NOTE — Progress Notes (Unsigned)
Cardiology Office Note   Date:  06/18/2022   ID:  Haley Sanders, DOB Feb 22, 1933, MRN [id number]  PCP:  Haley Solian, MD    No chief complaint on file.  Chronic diastolic heart failure  Wt Readings from Last 3 Encounters:  06/18/22 138 lb 6.4 oz (62.8 kg)  03/27/21 150 lb (68 kg)  01/29/21 144 lb 9.6 oz (65.6 kg)       History of Present Illness: Haley Sanders is a 89 y.o. female  with Who has a h/o chronic diastolic heart failure.  She had normal LV function.     She was on oxygen for a while as well.  She was taken off of oxygen since then.    In Jan 2020, "she had an elevated BP reading.  She had a change made to her BP meds.  BP has been better.   In the past, it was noted: "She has had some ankle edema.  Swelling worse at the end of the day. "   Swelling in ankles has persisted.  L>R. "   In 2021: "BP at home was in the [id number] systolic.  Clonidine was added by Dr. Dagmar Hait,  and it was better. "    Still has some episodes of high BP, up to 170s.  Usually in the 130-140 one hour after medicines.  A few hours after that, blood pressure tends to be in the 160 range.     Uses a stationary bike.  She feels better with exercise.  No chest pain with activity.      Past Medical History:  Diagnosis Date   Depression    Diabetes mellitus    GERD (gastroesophageal reflux disease)    Hypercholesterolemia    Hypertension    Hypothyroidism    Memory deficit    Osteoporosis     Past Surgical History:  Procedure Laterality Date   ABDOMINAL HYSTERECTOMY  2001   BIOPSY  05/10/2020   Procedure: BIOPSY;  Surgeon: Haley Essex, MD;  Location: [facility];  Service: Endoscopy;;   ESOPHAGOGASTRODUODENOSCOPY (EGD) WITH PROPOFOL N/A 05/10/2020   Procedure: ESOPHAGOGASTRODUODENOSCOPY (EGD) WITH PROPOFOL;  Surgeon: Haley Essex, MD;  Location: [facility];  Service: Endoscopy;  Laterality: N/A;   FOOT SURGERY  1990   right foot   ROTATOR CUFF REPAIR  1991   right   ROTATOR  CUFF REPAIR  2001   left side   TONSILLECTOMY  1959   TUBAL LIGATION  1975     Current Outpatient Medications  Medication Sig Dispense Refill   acetaminophen (TYLENOL) 650 MG CR tablet Take 650 mg by mouth every 8 (eight) hours as needed for pain.     hydrochlorothiazide (HYDRODIURIL) 25 MG tablet Take 1 tablet (25 mg total) by mouth daily for 30 days. 30 tablet 0   JANUVIA 100 MG tablet Take 100 mg by mouth daily.     melatonin 5 MG TABS Take 5 mg by mouth at bedtime as needed.     Mouthwashes (BIOTENE/CALCIUM PBF) LIQD Take one tablespoon by mouth (61ml) and rinse solution for 30 seconds then spit out 450 mL 0   senna (SENOKOT) 8.6 MG TABS tablet Take 1 tablet by mouth daily.     amLODipine (NORVASC) 5 MG tablet Take 1 tablet (5 mg total) by mouth 2 (two) times daily. 180 tablet 2   aspirin 81 MG tablet Take 81 mg by mouth daily.      atorvastatin (LIPITOR) 40 MG tablet Take 40  mg by mouth daily.     buPROPion (WELLBUTRIN SR) 150 MG 12 hr tablet Take 150 mg by mouth 2 (two) times daily.     cloNIDine (CATAPRES) 0.2 MG tablet Take 0.2 mg by mouth 2 (two) times daily.     donepezil (ARICEPT) 10 MG tablet Take 10 mg by mouth at bedtime.     esomeprazole (NEXIUM) 40 MG capsule Take 40 mg by mouth 2 (two) times daily before a meal.      FLUoxetine (PROZAC) 40 MG capsule Take 40 mg by mouth daily.     levothyroxine (SYNTHROID) 88 MCG tablet Take 88 mcg by mouth daily before breakfast.     Multiple Vitamins-Minerals (MULTIVITAMIN WITH MINERALS) tablet Take 1 tablet by mouth daily.     ONE TOUCH ULTRA TEST test strip 1 each by Other route as needed for other.      psyllium (REGULOID) 0.52 G capsule Take 0.52 g by mouth 3 (three) times daily.     repaglinide (PRANDIN) 0.5 MG tablet Take 0.5 tablets (0.25 mg total) by mouth 2 (two) times daily before a meal. 90 tablet 11   solifenacin (VESICARE) 5 MG tablet Take 5 mg by mouth daily.     sucralfate (CARAFATE) 1 g tablet Take 1 g by mouth 3  (three) times daily as needed (stomach acid before meals).     valsartan (DIOVAN) 160 MG tablet Take 1 tablet (160 mg total) by mouth daily. Please keep upcoming appt in Mar for further refills 30 tablet 1   No current facility-administered medications for this visit.    Allergies:   Codeine, Grass extracts [gramineae pollens], and Mold extract [trichophyton]    Social History:  The patient  reports that she quit smoking about 34 years ago. Her smoking use included cigarettes. She has a 33.00 pack-year smoking history. She has never used smokeless tobacco. She reports that she does not drink alcohol and does not use drugs.   Family History:  The patient's family history includes Alzheimer's disease in her sister; CAD in her son; Cancer in her mother; Heart attack in her father, maternal grandfather, and paternal grandmother; Stroke in her father.    ROS:  Please see the history of present illness.   Otherwise, review of systems are positive for occasional BP spikes in the afernoon to 140 mm Hg.   All other systems are reviewed and negative.    PHYSICAL EXAM: VS:  BP (!) 140/78   Pulse 68   Ht 5' (1.524 m)   Wt 138 lb 6.4 oz (62.8 kg)   SpO2 97%   BMI 27.03 kg/m  , BMI Body mass index is 27.03 kg/m. GEN: Well nourished, well developed, in no acute distress HEENT: normal Neck: no JVD, carotid bruits, or masses Cardiac: RRR; no murmurs, rubs, or gallops,no edema  Respiratory:  clear to auscultation bilaterally, normal work of breathing GI: soft, nontender, nondistended, + BS MS: no deformity or atrophy Skin: warm and dry, no rash Neuro:  Strength and sensation are intact Psych: euthymic mood, full affect   EKG:    The ekg ordered today demonstrates NSR, left axis deviation, poor R wave progression, nonspecific ST changes   Recent Labs: No results found for requested labs within last 365 days.   Lipid Panel No results found for: "CHOL", "TRIG", "HDL", "CHOLHDL", "VLDL",  "LDLCALC", "LDLDIRECT"   Other studies Reviewed: Additional studies/ records that were reviewed today with results demonstrating: labs reviewed.   ASSESSMENT AND PLAN:  Chronic diastolic heart failure: Appears euvolemic. Continue current meds. HOCM: Normal LV/RV/valvular function in 2023.  No aortic valve gradient. Hypertensive heart disease: The current medical regimen is effective;  continue present plan and medications. Can take valsartan in the morning to see if the afternoon increase of blood pressure will resolve. Diabetes: November 2023 A1c 6.3.  COntinue with exercise classes.  Lost 30 lbs last year due to bowel condition, loss of appetite.  Ankle edema: Better with leg elevation. Difficult for her to get compression stocking on.     Current medicines are reviewed at length with the patient today.  The patient concerns regarding her medicines were addressed.  The following changes have been made:  No change  Labs/ tests ordered today include:  No orders of the defined types were placed in this encounter.   Recommend 150 minutes/week of aerobic exercise Low fat, low carb, high fiber diet recommended  Disposition:   FU in 1 year   Signed, Larae Grooms, MD  06/18/2022 3:54 PM    [facility] [place], [place], [place]  [address] Phone: [phone]; Fax: [phone]

## 2022-06-18 ENCOUNTER — Ambulatory Visit: Payer: Medicare PPO | Attending: Interventional Cardiology | Admitting: Interventional Cardiology

## 2022-06-18 ENCOUNTER — Encounter: Payer: Self-pay | Admitting: Interventional Cardiology

## 2022-06-18 VITALS — BP 140/78 | HR 68 | Ht 60.0 in | Wt 138.4 lb

## 2022-06-18 DIAGNOSIS — M25472 Effusion, left ankle: Secondary | ICD-10-CM

## 2022-06-18 DIAGNOSIS — I421 Obstructive hypertrophic cardiomyopathy: Secondary | ICD-10-CM | POA: Diagnosis not present

## 2022-06-18 DIAGNOSIS — I5032 Chronic diastolic (congestive) heart failure: Secondary | ICD-10-CM | POA: Diagnosis not present

## 2022-06-18 DIAGNOSIS — I110 Hypertensive heart disease with heart failure: Secondary | ICD-10-CM | POA: Diagnosis not present

## 2022-06-18 DIAGNOSIS — M25471 Effusion, right ankle: Secondary | ICD-10-CM | POA: Diagnosis not present

## 2022-06-18 DIAGNOSIS — E119 Type 2 diabetes mellitus without complications: Secondary | ICD-10-CM | POA: Diagnosis not present

## 2022-06-18 NOTE — Patient Instructions (Signed)
Medication Instructions:  Your physician recommends that you continue on your current medications as directed. Please refer to the Current Medication list given to you today. Start taking your Valsartan in the mornings *If you need a refill on your cardiac medications before your next appointment, please call your pharmacy*   Lab Work: none If you have labs (blood work) drawn today and your tests are completely normal, you will receive your results only by: [place] (if you have MyChart) OR A paper copy in the mail If you have any lab test that is abnormal or we need to change your treatment, we will call you to review the results.   Testing/Procedures: none   Follow-Up: At [facility], you and your health needs are our priority.  As part of our continuing mission to provide you with exceptional heart care, we have created designated Provider Care Teams.  These Care Teams include your primary Cardiologist (physician) and Advanced Practice Providers (APPs -  Physician Assistants and Nurse Practitioners) who all work together to provide you with the care you need, when you need it.  We recommend signing up for the patient portal called "MyChart".  Sign up information is provided on this After Visit Summary.  MyChart is used to connect with patients for Virtual Visits (Telemedicine).  Patients are able to view lab/test results, encounter notes, upcoming appointments, etc.  Non-urgent messages can be sent to your provider as well.   To learn more about what you can do with MyChart, go to [email or web address].    Your next appointment:   12 month(s)  Provider:   Larae Grooms, MD     Other Instructions

## 2022-06-21 ENCOUNTER — Other Ambulatory Visit: Payer: Self-pay | Admitting: Interventional Cardiology

## 2022-07-17 DIAGNOSIS — R221 Localized swelling, mass and lump, neck: Secondary | ICD-10-CM | POA: Diagnosis not present

## 2022-07-17 DIAGNOSIS — J385 Laryngeal spasm: Secondary | ICD-10-CM | POA: Diagnosis not present

## 2022-08-04 DIAGNOSIS — G4733 Obstructive sleep apnea (adult) (pediatric): Secondary | ICD-10-CM | POA: Diagnosis not present

## 2022-08-06 DIAGNOSIS — E039 Hypothyroidism, unspecified: Secondary | ICD-10-CM | POA: Diagnosis not present

## 2022-08-06 DIAGNOSIS — R1312 Dysphagia, oropharyngeal phase: Secondary | ICD-10-CM | POA: Diagnosis not present

## 2022-08-06 DIAGNOSIS — I129 Hypertensive chronic kidney disease with stage 1 through stage 4 chronic kidney disease, or unspecified chronic kidney disease: Secondary | ICD-10-CM | POA: Diagnosis not present

## 2022-08-06 DIAGNOSIS — D649 Anemia, unspecified: Secondary | ICD-10-CM | POA: Diagnosis not present

## 2022-08-06 DIAGNOSIS — N1831 Chronic kidney disease, stage 3a: Secondary | ICD-10-CM | POA: Diagnosis not present

## 2022-08-06 DIAGNOSIS — I5030 Unspecified diastolic (congestive) heart failure: Secondary | ICD-10-CM | POA: Diagnosis not present

## 2022-08-06 DIAGNOSIS — I421 Obstructive hypertrophic cardiomyopathy: Secondary | ICD-10-CM | POA: Diagnosis not present

## 2022-08-06 DIAGNOSIS — E1140 Type 2 diabetes mellitus with diabetic neuropathy, unspecified: Secondary | ICD-10-CM | POA: Diagnosis not present

## 2022-08-06 DIAGNOSIS — J449 Chronic obstructive pulmonary disease, unspecified: Secondary | ICD-10-CM | POA: Diagnosis not present

## 2022-08-13 DIAGNOSIS — I739 Peripheral vascular disease, unspecified: Secondary | ICD-10-CM | POA: Diagnosis not present

## 2022-08-13 DIAGNOSIS — G609 Hereditary and idiopathic neuropathy, unspecified: Secondary | ICD-10-CM | POA: Diagnosis not present

## 2022-08-13 DIAGNOSIS — M19071 Primary osteoarthritis, right ankle and foot: Secondary | ICD-10-CM | POA: Diagnosis not present

## 2022-08-13 DIAGNOSIS — M2041 Other hammer toe(s) (acquired), right foot: Secondary | ICD-10-CM | POA: Diagnosis not present

## 2022-08-13 DIAGNOSIS — M79675 Pain in left toe(s): Secondary | ICD-10-CM | POA: Diagnosis not present

## 2022-08-13 DIAGNOSIS — E119 Type 2 diabetes mellitus without complications: Secondary | ICD-10-CM | POA: Diagnosis not present

## 2022-08-13 DIAGNOSIS — B351 Tinea unguium: Secondary | ICD-10-CM | POA: Diagnosis not present

## 2022-08-26 DIAGNOSIS — R1312 Dysphagia, oropharyngeal phase: Secondary | ICD-10-CM | POA: Diagnosis not present

## 2022-08-26 DIAGNOSIS — R140 Abdominal distension (gaseous): Secondary | ICD-10-CM | POA: Diagnosis not present

## 2022-08-26 DIAGNOSIS — K5909 Other constipation: Secondary | ICD-10-CM | POA: Diagnosis not present

## 2022-08-26 DIAGNOSIS — K219 Gastro-esophageal reflux disease without esophagitis: Secondary | ICD-10-CM | POA: Diagnosis not present

## 2022-09-11 DIAGNOSIS — I130 Hypertensive heart and chronic kidney disease with heart failure and stage 1 through stage 4 chronic kidney disease, or unspecified chronic kidney disease: Secondary | ICD-10-CM | POA: Diagnosis not present

## 2022-09-11 DIAGNOSIS — I421 Obstructive hypertrophic cardiomyopathy: Secondary | ICD-10-CM | POA: Diagnosis not present

## 2022-09-11 DIAGNOSIS — I5032 Chronic diastolic (congestive) heart failure: Secondary | ICD-10-CM | POA: Diagnosis not present

## 2022-09-11 DIAGNOSIS — D631 Anemia in chronic kidney disease: Secondary | ICD-10-CM | POA: Diagnosis not present

## 2022-09-11 DIAGNOSIS — J439 Emphysema, unspecified: Secondary | ICD-10-CM | POA: Diagnosis not present

## 2022-09-11 DIAGNOSIS — K760 Fatty (change of) liver, not elsewhere classified: Secondary | ICD-10-CM | POA: Diagnosis not present

## 2022-09-11 DIAGNOSIS — F03A3 Unspecified dementia, mild, with mood disturbance: Secondary | ICD-10-CM | POA: Diagnosis not present

## 2022-09-11 DIAGNOSIS — N1831 Chronic kidney disease, stage 3a: Secondary | ICD-10-CM | POA: Diagnosis not present

## 2022-09-11 DIAGNOSIS — F32A Depression, unspecified: Secondary | ICD-10-CM | POA: Diagnosis not present

## 2022-09-11 DIAGNOSIS — E1140 Type 2 diabetes mellitus with diabetic neuropathy, unspecified: Secondary | ICD-10-CM | POA: Diagnosis not present

## 2022-09-16 DIAGNOSIS — N1831 Chronic kidney disease, stage 3a: Secondary | ICD-10-CM | POA: Diagnosis not present

## 2022-09-16 DIAGNOSIS — I130 Hypertensive heart and chronic kidney disease with heart failure and stage 1 through stage 4 chronic kidney disease, or unspecified chronic kidney disease: Secondary | ICD-10-CM | POA: Diagnosis not present

## 2022-09-16 DIAGNOSIS — I5032 Chronic diastolic (congestive) heart failure: Secondary | ICD-10-CM | POA: Diagnosis not present

## 2022-09-16 DIAGNOSIS — F32A Depression, unspecified: Secondary | ICD-10-CM | POA: Diagnosis not present

## 2022-09-16 DIAGNOSIS — J439 Emphysema, unspecified: Secondary | ICD-10-CM | POA: Diagnosis not present

## 2022-09-16 DIAGNOSIS — I421 Obstructive hypertrophic cardiomyopathy: Secondary | ICD-10-CM | POA: Diagnosis not present

## 2022-09-16 DIAGNOSIS — D631 Anemia in chronic kidney disease: Secondary | ICD-10-CM | POA: Diagnosis not present

## 2022-09-16 DIAGNOSIS — F03A3 Unspecified dementia, mild, with mood disturbance: Secondary | ICD-10-CM | POA: Diagnosis not present

## 2022-09-16 DIAGNOSIS — E1140 Type 2 diabetes mellitus with diabetic neuropathy, unspecified: Secondary | ICD-10-CM | POA: Diagnosis not present

## 2022-09-18 DIAGNOSIS — F32A Depression, unspecified: Secondary | ICD-10-CM | POA: Diagnosis not present

## 2022-09-18 DIAGNOSIS — D631 Anemia in chronic kidney disease: Secondary | ICD-10-CM | POA: Diagnosis not present

## 2022-09-18 DIAGNOSIS — E1140 Type 2 diabetes mellitus with diabetic neuropathy, unspecified: Secondary | ICD-10-CM | POA: Diagnosis not present

## 2022-09-18 DIAGNOSIS — I130 Hypertensive heart and chronic kidney disease with heart failure and stage 1 through stage 4 chronic kidney disease, or unspecified chronic kidney disease: Secondary | ICD-10-CM | POA: Diagnosis not present

## 2022-09-18 DIAGNOSIS — I5032 Chronic diastolic (congestive) heart failure: Secondary | ICD-10-CM | POA: Diagnosis not present

## 2022-09-18 DIAGNOSIS — F03A3 Unspecified dementia, mild, with mood disturbance: Secondary | ICD-10-CM | POA: Diagnosis not present

## 2022-09-18 DIAGNOSIS — N1831 Chronic kidney disease, stage 3a: Secondary | ICD-10-CM | POA: Diagnosis not present

## 2022-09-18 DIAGNOSIS — J439 Emphysema, unspecified: Secondary | ICD-10-CM | POA: Diagnosis not present

## 2022-09-18 DIAGNOSIS — I421 Obstructive hypertrophic cardiomyopathy: Secondary | ICD-10-CM | POA: Diagnosis not present

## 2022-09-22 DIAGNOSIS — E1140 Type 2 diabetes mellitus with diabetic neuropathy, unspecified: Secondary | ICD-10-CM | POA: Diagnosis not present

## 2022-09-22 DIAGNOSIS — F03A3 Unspecified dementia, mild, with mood disturbance: Secondary | ICD-10-CM | POA: Diagnosis not present

## 2022-09-22 DIAGNOSIS — D631 Anemia in chronic kidney disease: Secondary | ICD-10-CM | POA: Diagnosis not present

## 2022-09-22 DIAGNOSIS — I5032 Chronic diastolic (congestive) heart failure: Secondary | ICD-10-CM | POA: Diagnosis not present

## 2022-09-22 DIAGNOSIS — I421 Obstructive hypertrophic cardiomyopathy: Secondary | ICD-10-CM | POA: Diagnosis not present

## 2022-09-22 DIAGNOSIS — N1831 Chronic kidney disease, stage 3a: Secondary | ICD-10-CM | POA: Diagnosis not present

## 2022-09-22 DIAGNOSIS — J439 Emphysema, unspecified: Secondary | ICD-10-CM | POA: Diagnosis not present

## 2022-09-22 DIAGNOSIS — F32A Depression, unspecified: Secondary | ICD-10-CM | POA: Diagnosis not present

## 2022-09-22 DIAGNOSIS — I130 Hypertensive heart and chronic kidney disease with heart failure and stage 1 through stage 4 chronic kidney disease, or unspecified chronic kidney disease: Secondary | ICD-10-CM | POA: Diagnosis not present

## 2022-09-24 DIAGNOSIS — I421 Obstructive hypertrophic cardiomyopathy: Secondary | ICD-10-CM | POA: Diagnosis not present

## 2022-09-24 DIAGNOSIS — E1140 Type 2 diabetes mellitus with diabetic neuropathy, unspecified: Secondary | ICD-10-CM | POA: Diagnosis not present

## 2022-09-24 DIAGNOSIS — N1831 Chronic kidney disease, stage 3a: Secondary | ICD-10-CM | POA: Diagnosis not present

## 2022-09-24 DIAGNOSIS — F32A Depression, unspecified: Secondary | ICD-10-CM | POA: Diagnosis not present

## 2022-09-24 DIAGNOSIS — F03A3 Unspecified dementia, mild, with mood disturbance: Secondary | ICD-10-CM | POA: Diagnosis not present

## 2022-09-24 DIAGNOSIS — I5032 Chronic diastolic (congestive) heart failure: Secondary | ICD-10-CM | POA: Diagnosis not present

## 2022-09-24 DIAGNOSIS — J439 Emphysema, unspecified: Secondary | ICD-10-CM | POA: Diagnosis not present

## 2022-09-24 DIAGNOSIS — I130 Hypertensive heart and chronic kidney disease with heart failure and stage 1 through stage 4 chronic kidney disease, or unspecified chronic kidney disease: Secondary | ICD-10-CM | POA: Diagnosis not present

## 2022-09-24 DIAGNOSIS — D631 Anemia in chronic kidney disease: Secondary | ICD-10-CM | POA: Diagnosis not present

## 2022-09-26 ENCOUNTER — Other Ambulatory Visit: Payer: Self-pay | Admitting: Interventional Cardiology

## 2022-09-26 DIAGNOSIS — I110 Hypertensive heart disease with heart failure: Secondary | ICD-10-CM

## 2022-09-30 DIAGNOSIS — F03A3 Unspecified dementia, mild, with mood disturbance: Secondary | ICD-10-CM | POA: Diagnosis not present

## 2022-09-30 DIAGNOSIS — F32A Depression, unspecified: Secondary | ICD-10-CM | POA: Diagnosis not present

## 2022-09-30 DIAGNOSIS — N1831 Chronic kidney disease, stage 3a: Secondary | ICD-10-CM | POA: Diagnosis not present

## 2022-09-30 DIAGNOSIS — D631 Anemia in chronic kidney disease: Secondary | ICD-10-CM | POA: Diagnosis not present

## 2022-09-30 DIAGNOSIS — I5032 Chronic diastolic (congestive) heart failure: Secondary | ICD-10-CM | POA: Diagnosis not present

## 2022-09-30 DIAGNOSIS — I421 Obstructive hypertrophic cardiomyopathy: Secondary | ICD-10-CM | POA: Diagnosis not present

## 2022-09-30 DIAGNOSIS — E1140 Type 2 diabetes mellitus with diabetic neuropathy, unspecified: Secondary | ICD-10-CM | POA: Diagnosis not present

## 2022-09-30 DIAGNOSIS — I130 Hypertensive heart and chronic kidney disease with heart failure and stage 1 through stage 4 chronic kidney disease, or unspecified chronic kidney disease: Secondary | ICD-10-CM | POA: Diagnosis not present

## 2022-09-30 DIAGNOSIS — J439 Emphysema, unspecified: Secondary | ICD-10-CM | POA: Diagnosis not present

## 2022-10-02 DIAGNOSIS — I130 Hypertensive heart and chronic kidney disease with heart failure and stage 1 through stage 4 chronic kidney disease, or unspecified chronic kidney disease: Secondary | ICD-10-CM | POA: Diagnosis not present

## 2022-10-02 DIAGNOSIS — D631 Anemia in chronic kidney disease: Secondary | ICD-10-CM | POA: Diagnosis not present

## 2022-10-02 DIAGNOSIS — F03A3 Unspecified dementia, mild, with mood disturbance: Secondary | ICD-10-CM | POA: Diagnosis not present

## 2022-10-02 DIAGNOSIS — E1140 Type 2 diabetes mellitus with diabetic neuropathy, unspecified: Secondary | ICD-10-CM | POA: Diagnosis not present

## 2022-10-02 DIAGNOSIS — I421 Obstructive hypertrophic cardiomyopathy: Secondary | ICD-10-CM | POA: Diagnosis not present

## 2022-10-02 DIAGNOSIS — F32A Depression, unspecified: Secondary | ICD-10-CM | POA: Diagnosis not present

## 2022-10-02 DIAGNOSIS — J439 Emphysema, unspecified: Secondary | ICD-10-CM | POA: Diagnosis not present

## 2022-10-02 DIAGNOSIS — I5032 Chronic diastolic (congestive) heart failure: Secondary | ICD-10-CM | POA: Diagnosis not present

## 2022-10-02 DIAGNOSIS — N1831 Chronic kidney disease, stage 3a: Secondary | ICD-10-CM | POA: Diagnosis not present

## 2022-10-07 DIAGNOSIS — N1831 Chronic kidney disease, stage 3a: Secondary | ICD-10-CM | POA: Diagnosis not present

## 2022-10-07 DIAGNOSIS — I130 Hypertensive heart and chronic kidney disease with heart failure and stage 1 through stage 4 chronic kidney disease, or unspecified chronic kidney disease: Secondary | ICD-10-CM | POA: Diagnosis not present

## 2022-10-07 DIAGNOSIS — F03A3 Unspecified dementia, mild, with mood disturbance: Secondary | ICD-10-CM | POA: Diagnosis not present

## 2022-10-07 DIAGNOSIS — D631 Anemia in chronic kidney disease: Secondary | ICD-10-CM | POA: Diagnosis not present

## 2022-10-07 DIAGNOSIS — J439 Emphysema, unspecified: Secondary | ICD-10-CM | POA: Diagnosis not present

## 2022-10-07 DIAGNOSIS — E1140 Type 2 diabetes mellitus with diabetic neuropathy, unspecified: Secondary | ICD-10-CM | POA: Diagnosis not present

## 2022-10-07 DIAGNOSIS — I421 Obstructive hypertrophic cardiomyopathy: Secondary | ICD-10-CM | POA: Diagnosis not present

## 2022-10-07 DIAGNOSIS — F32A Depression, unspecified: Secondary | ICD-10-CM | POA: Diagnosis not present

## 2022-10-07 DIAGNOSIS — I5032 Chronic diastolic (congestive) heart failure: Secondary | ICD-10-CM | POA: Diagnosis not present

## 2022-10-15 DIAGNOSIS — I5032 Chronic diastolic (congestive) heart failure: Secondary | ICD-10-CM | POA: Diagnosis not present

## 2022-10-15 DIAGNOSIS — F32A Depression, unspecified: Secondary | ICD-10-CM | POA: Diagnosis not present

## 2022-10-15 DIAGNOSIS — J439 Emphysema, unspecified: Secondary | ICD-10-CM | POA: Diagnosis not present

## 2022-10-15 DIAGNOSIS — I130 Hypertensive heart and chronic kidney disease with heart failure and stage 1 through stage 4 chronic kidney disease, or unspecified chronic kidney disease: Secondary | ICD-10-CM | POA: Diagnosis not present

## 2022-10-15 DIAGNOSIS — D631 Anemia in chronic kidney disease: Secondary | ICD-10-CM | POA: Diagnosis not present

## 2022-10-15 DIAGNOSIS — E1140 Type 2 diabetes mellitus with diabetic neuropathy, unspecified: Secondary | ICD-10-CM | POA: Diagnosis not present

## 2022-10-15 DIAGNOSIS — I421 Obstructive hypertrophic cardiomyopathy: Secondary | ICD-10-CM | POA: Diagnosis not present

## 2022-10-15 DIAGNOSIS — F03A3 Unspecified dementia, mild, with mood disturbance: Secondary | ICD-10-CM | POA: Diagnosis not present

## 2022-10-15 DIAGNOSIS — N1831 Chronic kidney disease, stage 3a: Secondary | ICD-10-CM | POA: Diagnosis not present

## 2022-10-21 DIAGNOSIS — F03A3 Unspecified dementia, mild, with mood disturbance: Secondary | ICD-10-CM | POA: Diagnosis not present

## 2022-10-21 DIAGNOSIS — E1140 Type 2 diabetes mellitus with diabetic neuropathy, unspecified: Secondary | ICD-10-CM | POA: Diagnosis not present

## 2022-10-21 DIAGNOSIS — I130 Hypertensive heart and chronic kidney disease with heart failure and stage 1 through stage 4 chronic kidney disease, or unspecified chronic kidney disease: Secondary | ICD-10-CM | POA: Diagnosis not present

## 2022-10-21 DIAGNOSIS — F32A Depression, unspecified: Secondary | ICD-10-CM | POA: Diagnosis not present

## 2022-10-21 DIAGNOSIS — D631 Anemia in chronic kidney disease: Secondary | ICD-10-CM | POA: Diagnosis not present

## 2022-10-21 DIAGNOSIS — N1831 Chronic kidney disease, stage 3a: Secondary | ICD-10-CM | POA: Diagnosis not present

## 2022-10-21 DIAGNOSIS — I5032 Chronic diastolic (congestive) heart failure: Secondary | ICD-10-CM | POA: Diagnosis not present

## 2022-10-21 DIAGNOSIS — I421 Obstructive hypertrophic cardiomyopathy: Secondary | ICD-10-CM | POA: Diagnosis not present

## 2022-10-21 DIAGNOSIS — J439 Emphysema, unspecified: Secondary | ICD-10-CM | POA: Diagnosis not present

## 2022-10-29 DIAGNOSIS — M5136 Other intervertebral disc degeneration, lumbar region: Secondary | ICD-10-CM | POA: Diagnosis not present

## 2022-10-30 DIAGNOSIS — I5032 Chronic diastolic (congestive) heart failure: Secondary | ICD-10-CM | POA: Diagnosis not present

## 2022-10-30 DIAGNOSIS — I421 Obstructive hypertrophic cardiomyopathy: Secondary | ICD-10-CM | POA: Diagnosis not present

## 2022-10-30 DIAGNOSIS — I130 Hypertensive heart and chronic kidney disease with heart failure and stage 1 through stage 4 chronic kidney disease, or unspecified chronic kidney disease: Secondary | ICD-10-CM | POA: Diagnosis not present

## 2022-10-30 DIAGNOSIS — D631 Anemia in chronic kidney disease: Secondary | ICD-10-CM | POA: Diagnosis not present

## 2022-10-30 DIAGNOSIS — F32A Depression, unspecified: Secondary | ICD-10-CM | POA: Diagnosis not present

## 2022-10-30 DIAGNOSIS — E1140 Type 2 diabetes mellitus with diabetic neuropathy, unspecified: Secondary | ICD-10-CM | POA: Diagnosis not present

## 2022-10-30 DIAGNOSIS — N1831 Chronic kidney disease, stage 3a: Secondary | ICD-10-CM | POA: Diagnosis not present

## 2022-10-30 DIAGNOSIS — J439 Emphysema, unspecified: Secondary | ICD-10-CM | POA: Diagnosis not present

## 2022-10-30 DIAGNOSIS — F03A3 Unspecified dementia, mild, with mood disturbance: Secondary | ICD-10-CM | POA: Diagnosis not present

## 2022-11-03 DIAGNOSIS — J439 Emphysema, unspecified: Secondary | ICD-10-CM | POA: Diagnosis not present

## 2022-11-03 DIAGNOSIS — D631 Anemia in chronic kidney disease: Secondary | ICD-10-CM | POA: Diagnosis not present

## 2022-11-03 DIAGNOSIS — I5032 Chronic diastolic (congestive) heart failure: Secondary | ICD-10-CM | POA: Diagnosis not present

## 2022-11-03 DIAGNOSIS — I421 Obstructive hypertrophic cardiomyopathy: Secondary | ICD-10-CM | POA: Diagnosis not present

## 2022-11-03 DIAGNOSIS — I130 Hypertensive heart and chronic kidney disease with heart failure and stage 1 through stage 4 chronic kidney disease, or unspecified chronic kidney disease: Secondary | ICD-10-CM | POA: Diagnosis not present

## 2022-11-03 DIAGNOSIS — N1831 Chronic kidney disease, stage 3a: Secondary | ICD-10-CM | POA: Diagnosis not present

## 2022-11-03 DIAGNOSIS — F32A Depression, unspecified: Secondary | ICD-10-CM | POA: Diagnosis not present

## 2022-11-03 DIAGNOSIS — F03A3 Unspecified dementia, mild, with mood disturbance: Secondary | ICD-10-CM | POA: Diagnosis not present

## 2022-11-03 DIAGNOSIS — E1140 Type 2 diabetes mellitus with diabetic neuropathy, unspecified: Secondary | ICD-10-CM | POA: Diagnosis not present

## 2022-11-05 DIAGNOSIS — J449 Chronic obstructive pulmonary disease, unspecified: Secondary | ICD-10-CM | POA: Diagnosis not present

## 2022-12-03 DIAGNOSIS — M5136 Other intervertebral disc degeneration, lumbar region: Secondary | ICD-10-CM | POA: Diagnosis not present

## 2022-12-08 DIAGNOSIS — N3946 Mixed incontinence: Secondary | ICD-10-CM | POA: Diagnosis not present

## 2022-12-16 DIAGNOSIS — F0390 Unspecified dementia without behavioral disturbance: Secondary | ICD-10-CM | POA: Diagnosis not present

## 2022-12-16 DIAGNOSIS — I421 Obstructive hypertrophic cardiomyopathy: Secondary | ICD-10-CM | POA: Diagnosis not present

## 2022-12-16 DIAGNOSIS — I5030 Unspecified diastolic (congestive) heart failure: Secondary | ICD-10-CM | POA: Diagnosis not present

## 2022-12-16 DIAGNOSIS — E1140 Type 2 diabetes mellitus with diabetic neuropathy, unspecified: Secondary | ICD-10-CM | POA: Diagnosis not present

## 2022-12-16 DIAGNOSIS — I130 Hypertensive heart and chronic kidney disease with heart failure and stage 1 through stage 4 chronic kidney disease, or unspecified chronic kidney disease: Secondary | ICD-10-CM | POA: Diagnosis not present

## 2022-12-16 DIAGNOSIS — N1831 Chronic kidney disease, stage 3a: Secondary | ICD-10-CM | POA: Diagnosis not present

## 2022-12-16 DIAGNOSIS — J309 Allergic rhinitis, unspecified: Secondary | ICD-10-CM | POA: Diagnosis not present

## 2022-12-16 DIAGNOSIS — J449 Chronic obstructive pulmonary disease, unspecified: Secondary | ICD-10-CM | POA: Diagnosis not present

## 2023-01-02 DIAGNOSIS — H52203 Unspecified astigmatism, bilateral: Secondary | ICD-10-CM | POA: Diagnosis not present

## 2023-01-02 DIAGNOSIS — E119 Type 2 diabetes mellitus without complications: Secondary | ICD-10-CM | POA: Diagnosis not present

## 2023-02-24 DIAGNOSIS — Z1331 Encounter for screening for depression: Secondary | ICD-10-CM | POA: Diagnosis not present

## 2023-02-24 DIAGNOSIS — E782 Mixed hyperlipidemia: Secondary | ICD-10-CM | POA: Diagnosis not present

## 2023-02-24 DIAGNOSIS — E1151 Type 2 diabetes mellitus with diabetic peripheral angiopathy without gangrene: Secondary | ICD-10-CM | POA: Diagnosis not present

## 2023-02-24 DIAGNOSIS — I10 Essential (primary) hypertension: Secondary | ICD-10-CM | POA: Diagnosis not present

## 2023-02-24 DIAGNOSIS — E119 Type 2 diabetes mellitus without complications: Secondary | ICD-10-CM | POA: Diagnosis not present

## 2023-02-24 DIAGNOSIS — Z1330 Encounter for screening examination for mental health and behavioral disorders, unspecified: Secondary | ICD-10-CM | POA: Diagnosis not present

## 2023-03-02 DIAGNOSIS — B351 Tinea unguium: Secondary | ICD-10-CM | POA: Diagnosis not present

## 2023-03-02 DIAGNOSIS — L603 Nail dystrophy: Secondary | ICD-10-CM | POA: Diagnosis not present

## 2023-03-02 DIAGNOSIS — G4733 Obstructive sleep apnea (adult) (pediatric): Secondary | ICD-10-CM | POA: Diagnosis not present

## 2023-03-02 DIAGNOSIS — E1142 Type 2 diabetes mellitus with diabetic polyneuropathy: Secondary | ICD-10-CM | POA: Diagnosis not present

## 2023-03-02 DIAGNOSIS — L03031 Cellulitis of right toe: Secondary | ICD-10-CM | POA: Diagnosis not present

## 2023-03-24 DIAGNOSIS — M48061 Spinal stenosis, lumbar region without neurogenic claudication: Secondary | ICD-10-CM | POA: Diagnosis not present

## 2023-03-24 DIAGNOSIS — M1711 Unilateral primary osteoarthritis, right knee: Secondary | ICD-10-CM | POA: Diagnosis not present

## 2023-03-31 DIAGNOSIS — M5416 Radiculopathy, lumbar region: Secondary | ICD-10-CM | POA: Diagnosis not present

## 2023-03-31 DIAGNOSIS — Z5181 Encounter for therapeutic drug level monitoring: Secondary | ICD-10-CM | POA: Diagnosis not present

## 2023-03-31 DIAGNOSIS — Z0000 Encounter for general adult medical examination without abnormal findings: Secondary | ICD-10-CM | POA: Diagnosis not present

## 2023-03-31 DIAGNOSIS — Z1331 Encounter for screening for depression: Secondary | ICD-10-CM | POA: Diagnosis not present

## 2023-03-31 DIAGNOSIS — E119 Type 2 diabetes mellitus without complications: Secondary | ICD-10-CM | POA: Diagnosis not present

## 2023-03-31 DIAGNOSIS — Z1330 Encounter for screening examination for mental health and behavioral disorders, unspecified: Secondary | ICD-10-CM | POA: Diagnosis not present

## 2023-03-31 DIAGNOSIS — Z79899 Other long term (current) drug therapy: Secondary | ICD-10-CM | POA: Diagnosis not present

## 2023-03-31 DIAGNOSIS — I5189 Other ill-defined heart diseases: Secondary | ICD-10-CM | POA: Diagnosis not present

## 2023-03-31 DIAGNOSIS — N951 Menopausal and female climacteric states: Secondary | ICD-10-CM | POA: Diagnosis not present

## 2023-04-06 DIAGNOSIS — M545 Low back pain, unspecified: Secondary | ICD-10-CM | POA: Diagnosis not present

## 2023-04-12 ENCOUNTER — Other Ambulatory Visit: Payer: Self-pay | Admitting: Interventional Cardiology

## 2023-04-14 ENCOUNTER — Other Ambulatory Visit: Payer: Self-pay

## 2023-05-01 DIAGNOSIS — R92323 Mammographic fibroglandular density, bilateral breasts: Secondary | ICD-10-CM | POA: Diagnosis not present

## 2023-05-01 DIAGNOSIS — Z1231 Encounter for screening mammogram for malignant neoplasm of breast: Secondary | ICD-10-CM | POA: Diagnosis not present

## 2023-05-20 DIAGNOSIS — I10 Essential (primary) hypertension: Secondary | ICD-10-CM | POA: Diagnosis not present

## 2023-05-20 DIAGNOSIS — I429 Cardiomyopathy, unspecified: Secondary | ICD-10-CM | POA: Diagnosis not present

## 2023-05-20 DIAGNOSIS — J439 Emphysema, unspecified: Secondary | ICD-10-CM | POA: Diagnosis not present

## 2023-05-20 DIAGNOSIS — E890 Postprocedural hypothyroidism: Secondary | ICD-10-CM | POA: Diagnosis not present

## 2023-05-20 DIAGNOSIS — I440 Atrioventricular block, first degree: Secondary | ICD-10-CM | POA: Diagnosis not present

## 2023-05-20 DIAGNOSIS — D649 Anemia, unspecified: Secondary | ICD-10-CM | POA: Diagnosis not present

## 2023-05-20 DIAGNOSIS — I5189 Other ill-defined heart diseases: Secondary | ICD-10-CM | POA: Diagnosis not present

## 2023-05-20 DIAGNOSIS — I509 Heart failure, unspecified: Secondary | ICD-10-CM | POA: Diagnosis not present

## 2023-05-20 DIAGNOSIS — E119 Type 2 diabetes mellitus without complications: Secondary | ICD-10-CM | POA: Diagnosis not present

## 2023-05-20 DIAGNOSIS — E782 Mixed hyperlipidemia: Secondary | ICD-10-CM | POA: Diagnosis not present

## 2023-05-20 DIAGNOSIS — I110 Hypertensive heart disease with heart failure: Secondary | ICD-10-CM | POA: Diagnosis not present

## 2023-05-20 DIAGNOSIS — Z87891 Personal history of nicotine dependence: Secondary | ICD-10-CM | POA: Diagnosis not present

## 2023-05-28 DIAGNOSIS — N951 Menopausal and female climacteric states: Secondary | ICD-10-CM | POA: Diagnosis not present

## 2023-06-02 DIAGNOSIS — L603 Nail dystrophy: Secondary | ICD-10-CM | POA: Diagnosis not present

## 2023-06-02 DIAGNOSIS — B351 Tinea unguium: Secondary | ICD-10-CM | POA: Diagnosis not present

## 2023-06-02 DIAGNOSIS — M76821 Posterior tibial tendinitis, right leg: Secondary | ICD-10-CM | POA: Diagnosis not present

## 2023-06-10 DIAGNOSIS — I5189 Other ill-defined heart diseases: Secondary | ICD-10-CM | POA: Diagnosis not present

## 2023-06-11 DIAGNOSIS — I5189 Other ill-defined heart diseases: Secondary | ICD-10-CM | POA: Diagnosis not present

## 2023-06-14 DIAGNOSIS — K449 Diaphragmatic hernia without obstruction or gangrene: Secondary | ICD-10-CM | POA: Diagnosis not present

## 2023-06-14 DIAGNOSIS — E039 Hypothyroidism, unspecified: Secondary | ICD-10-CM | POA: Diagnosis not present

## 2023-06-14 DIAGNOSIS — R110 Nausea: Secondary | ICD-10-CM | POA: Diagnosis not present

## 2023-06-14 DIAGNOSIS — R1011 Right upper quadrant pain: Secondary | ICD-10-CM | POA: Diagnosis not present

## 2023-06-14 DIAGNOSIS — R1013 Epigastric pain: Secondary | ICD-10-CM | POA: Diagnosis not present

## 2023-06-14 DIAGNOSIS — K5909 Other constipation: Secondary | ICD-10-CM | POA: Diagnosis not present

## 2023-06-14 DIAGNOSIS — I5032 Chronic diastolic (congestive) heart failure: Secondary | ICD-10-CM | POA: Diagnosis not present

## 2023-06-14 DIAGNOSIS — I110 Hypertensive heart disease with heart failure: Secondary | ICD-10-CM | POA: Diagnosis not present

## 2023-06-14 DIAGNOSIS — I160 Hypertensive urgency: Secondary | ICD-10-CM | POA: Diagnosis not present

## 2023-06-14 DIAGNOSIS — E119 Type 2 diabetes mellitus without complications: Secondary | ICD-10-CM | POA: Diagnosis not present

## 2023-06-14 DIAGNOSIS — R4182 Altered mental status, unspecified: Secondary | ICD-10-CM | POA: Diagnosis not present

## 2023-06-14 DIAGNOSIS — E870 Hyperosmolality and hypernatremia: Secondary | ICD-10-CM | POA: Diagnosis not present

## 2023-06-14 DIAGNOSIS — J438 Other emphysema: Secondary | ICD-10-CM | POA: Diagnosis not present

## 2023-06-14 DIAGNOSIS — F32A Depression, unspecified: Secondary | ICD-10-CM | POA: Diagnosis not present

## 2023-06-15 DIAGNOSIS — E119 Type 2 diabetes mellitus without complications: Secondary | ICD-10-CM | POA: Diagnosis not present

## 2023-06-15 DIAGNOSIS — F32A Depression, unspecified: Secondary | ICD-10-CM | POA: Diagnosis not present

## 2023-06-15 DIAGNOSIS — E039 Hypothyroidism, unspecified: Secondary | ICD-10-CM | POA: Diagnosis not present

## 2023-06-15 DIAGNOSIS — J438 Other emphysema: Secondary | ICD-10-CM | POA: Diagnosis not present

## 2023-06-15 DIAGNOSIS — I5032 Chronic diastolic (congestive) heart failure: Secondary | ICD-10-CM | POA: Diagnosis not present

## 2023-06-15 DIAGNOSIS — R1013 Epigastric pain: Secondary | ICD-10-CM | POA: Diagnosis not present

## 2023-06-15 DIAGNOSIS — I110 Hypertensive heart disease with heart failure: Secondary | ICD-10-CM | POA: Diagnosis not present

## 2023-06-15 DIAGNOSIS — R110 Nausea: Secondary | ICD-10-CM | POA: Diagnosis not present

## 2023-06-15 DIAGNOSIS — K5909 Other constipation: Secondary | ICD-10-CM | POA: Diagnosis not present

## 2023-06-15 DIAGNOSIS — I160 Hypertensive urgency: Secondary | ICD-10-CM | POA: Diagnosis not present

## 2023-06-15 DIAGNOSIS — E870 Hyperosmolality and hypernatremia: Secondary | ICD-10-CM | POA: Diagnosis not present

## 2023-06-15 DIAGNOSIS — R1011 Right upper quadrant pain: Secondary | ICD-10-CM | POA: Diagnosis not present

## 2023-06-16 DIAGNOSIS — K5909 Other constipation: Secondary | ICD-10-CM | POA: Diagnosis not present

## 2023-06-16 DIAGNOSIS — I5032 Chronic diastolic (congestive) heart failure: Secondary | ICD-10-CM | POA: Diagnosis not present

## 2023-06-16 DIAGNOSIS — F32A Depression, unspecified: Secondary | ICD-10-CM | POA: Diagnosis not present

## 2023-06-16 DIAGNOSIS — I110 Hypertensive heart disease with heart failure: Secondary | ICD-10-CM | POA: Diagnosis not present

## 2023-06-16 DIAGNOSIS — R2681 Unsteadiness on feet: Secondary | ICD-10-CM | POA: Diagnosis not present

## 2023-06-16 DIAGNOSIS — E870 Hyperosmolality and hypernatremia: Secondary | ICD-10-CM | POA: Diagnosis not present

## 2023-06-16 DIAGNOSIS — E119 Type 2 diabetes mellitus without complications: Secondary | ICD-10-CM | POA: Diagnosis not present

## 2023-06-16 DIAGNOSIS — J438 Other emphysema: Secondary | ICD-10-CM | POA: Diagnosis not present

## 2023-06-16 DIAGNOSIS — E039 Hypothyroidism, unspecified: Secondary | ICD-10-CM | POA: Diagnosis not present

## 2023-06-16 DIAGNOSIS — R110 Nausea: Secondary | ICD-10-CM | POA: Diagnosis not present

## 2023-06-16 DIAGNOSIS — R1011 Right upper quadrant pain: Secondary | ICD-10-CM | POA: Diagnosis not present

## 2023-06-16 DIAGNOSIS — I160 Hypertensive urgency: Secondary | ICD-10-CM | POA: Diagnosis not present

## 2023-06-16 DIAGNOSIS — R7989 Other specified abnormal findings of blood chemistry: Secondary | ICD-10-CM | POA: Diagnosis not present

## 2023-06-17 DIAGNOSIS — R110 Nausea: Secondary | ICD-10-CM | POA: Diagnosis not present

## 2023-06-17 DIAGNOSIS — R2681 Unsteadiness on feet: Secondary | ICD-10-CM | POA: Diagnosis not present

## 2023-06-17 DIAGNOSIS — E119 Type 2 diabetes mellitus without complications: Secondary | ICD-10-CM | POA: Diagnosis not present

## 2023-06-17 DIAGNOSIS — R1011 Right upper quadrant pain: Secondary | ICD-10-CM | POA: Diagnosis not present

## 2023-06-17 DIAGNOSIS — E870 Hyperosmolality and hypernatremia: Secondary | ICD-10-CM | POA: Diagnosis not present

## 2023-06-17 DIAGNOSIS — I110 Hypertensive heart disease with heart failure: Secondary | ICD-10-CM | POA: Diagnosis not present

## 2023-06-17 DIAGNOSIS — F32A Depression, unspecified: Secondary | ICD-10-CM | POA: Diagnosis not present

## 2023-06-17 DIAGNOSIS — I5032 Chronic diastolic (congestive) heart failure: Secondary | ICD-10-CM | POA: Diagnosis not present

## 2023-06-17 DIAGNOSIS — K5909 Other constipation: Secondary | ICD-10-CM | POA: Diagnosis not present

## 2023-06-17 DIAGNOSIS — E039 Hypothyroidism, unspecified: Secondary | ICD-10-CM | POA: Diagnosis not present

## 2023-06-17 DIAGNOSIS — R7989 Other specified abnormal findings of blood chemistry: Secondary | ICD-10-CM | POA: Diagnosis not present

## 2023-06-17 DIAGNOSIS — J438 Other emphysema: Secondary | ICD-10-CM | POA: Diagnosis not present

## 2023-06-17 DIAGNOSIS — I160 Hypertensive urgency: Secondary | ICD-10-CM | POA: Diagnosis not present

## 2023-06-20 DIAGNOSIS — K449 Diaphragmatic hernia without obstruction or gangrene: Secondary | ICD-10-CM | POA: Diagnosis not present

## 2023-06-20 DIAGNOSIS — F32A Depression, unspecified: Secondary | ICD-10-CM | POA: Diagnosis not present

## 2023-06-20 DIAGNOSIS — I160 Hypertensive urgency: Secondary | ICD-10-CM | POA: Diagnosis not present

## 2023-06-20 DIAGNOSIS — K8581 Other acute pancreatitis with uninfected necrosis: Secondary | ICD-10-CM | POA: Diagnosis not present

## 2023-06-20 DIAGNOSIS — J438 Other emphysema: Secondary | ICD-10-CM | POA: Diagnosis not present

## 2023-06-20 DIAGNOSIS — I110 Hypertensive heart disease with heart failure: Secondary | ICD-10-CM | POA: Diagnosis not present

## 2023-06-20 DIAGNOSIS — E1151 Type 2 diabetes mellitus with diabetic peripheral angiopathy without gangrene: Secondary | ICD-10-CM | POA: Diagnosis not present

## 2023-06-20 DIAGNOSIS — I5032 Chronic diastolic (congestive) heart failure: Secondary | ICD-10-CM | POA: Diagnosis not present

## 2023-06-20 DIAGNOSIS — F419 Anxiety disorder, unspecified: Secondary | ICD-10-CM | POA: Diagnosis not present

## 2023-06-22 DIAGNOSIS — E039 Hypothyroidism, unspecified: Secondary | ICD-10-CM | POA: Diagnosis not present

## 2023-06-22 DIAGNOSIS — I10 Essential (primary) hypertension: Secondary | ICD-10-CM | POA: Diagnosis not present

## 2023-06-23 DIAGNOSIS — R1011 Right upper quadrant pain: Secondary | ICD-10-CM | POA: Diagnosis not present

## 2023-06-23 DIAGNOSIS — R140 Abdominal distension (gaseous): Secondary | ICD-10-CM | POA: Diagnosis not present

## 2023-06-23 DIAGNOSIS — R7989 Other specified abnormal findings of blood chemistry: Secondary | ICD-10-CM | POA: Diagnosis not present

## 2023-06-23 DIAGNOSIS — R1013 Epigastric pain: Secondary | ICD-10-CM | POA: Diagnosis not present

## 2023-06-23 DIAGNOSIS — R0602 Shortness of breath: Secondary | ICD-10-CM | POA: Diagnosis not present

## 2023-06-25 DIAGNOSIS — J438 Other emphysema: Secondary | ICD-10-CM | POA: Diagnosis not present

## 2023-06-25 DIAGNOSIS — I110 Hypertensive heart disease with heart failure: Secondary | ICD-10-CM | POA: Diagnosis not present

## 2023-06-25 DIAGNOSIS — I160 Hypertensive urgency: Secondary | ICD-10-CM | POA: Diagnosis not present

## 2023-06-25 DIAGNOSIS — K449 Diaphragmatic hernia without obstruction or gangrene: Secondary | ICD-10-CM | POA: Diagnosis not present

## 2023-06-25 DIAGNOSIS — F32A Depression, unspecified: Secondary | ICD-10-CM | POA: Diagnosis not present

## 2023-06-25 DIAGNOSIS — E1151 Type 2 diabetes mellitus with diabetic peripheral angiopathy without gangrene: Secondary | ICD-10-CM | POA: Diagnosis not present

## 2023-06-25 DIAGNOSIS — F419 Anxiety disorder, unspecified: Secondary | ICD-10-CM | POA: Diagnosis not present

## 2023-06-25 DIAGNOSIS — I5032 Chronic diastolic (congestive) heart failure: Secondary | ICD-10-CM | POA: Diagnosis not present

## 2023-06-25 DIAGNOSIS — K8581 Other acute pancreatitis with uninfected necrosis: Secondary | ICD-10-CM | POA: Diagnosis not present

## 2023-06-26 ENCOUNTER — Other Ambulatory Visit: Payer: Self-pay | Admitting: Interventional Cardiology

## 2023-07-01 DIAGNOSIS — E1151 Type 2 diabetes mellitus with diabetic peripheral angiopathy without gangrene: Secondary | ICD-10-CM | POA: Diagnosis not present

## 2023-07-01 DIAGNOSIS — I5032 Chronic diastolic (congestive) heart failure: Secondary | ICD-10-CM | POA: Diagnosis not present

## 2023-07-01 DIAGNOSIS — I110 Hypertensive heart disease with heart failure: Secondary | ICD-10-CM | POA: Diagnosis not present

## 2023-07-01 DIAGNOSIS — F419 Anxiety disorder, unspecified: Secondary | ICD-10-CM | POA: Diagnosis not present

## 2023-07-01 DIAGNOSIS — K449 Diaphragmatic hernia without obstruction or gangrene: Secondary | ICD-10-CM | POA: Diagnosis not present

## 2023-07-01 DIAGNOSIS — J438 Other emphysema: Secondary | ICD-10-CM | POA: Diagnosis not present

## 2023-07-01 DIAGNOSIS — I160 Hypertensive urgency: Secondary | ICD-10-CM | POA: Diagnosis not present

## 2023-07-01 DIAGNOSIS — K8581 Other acute pancreatitis with uninfected necrosis: Secondary | ICD-10-CM | POA: Diagnosis not present

## 2023-07-01 DIAGNOSIS — F32A Depression, unspecified: Secondary | ICD-10-CM | POA: Diagnosis not present

## 2023-07-07 DIAGNOSIS — I110 Hypertensive heart disease with heart failure: Secondary | ICD-10-CM | POA: Diagnosis not present

## 2023-07-07 DIAGNOSIS — J438 Other emphysema: Secondary | ICD-10-CM | POA: Diagnosis not present

## 2023-07-07 DIAGNOSIS — I5032 Chronic diastolic (congestive) heart failure: Secondary | ICD-10-CM | POA: Diagnosis not present

## 2023-07-07 DIAGNOSIS — K8581 Other acute pancreatitis with uninfected necrosis: Secondary | ICD-10-CM | POA: Diagnosis not present

## 2023-07-07 DIAGNOSIS — E1151 Type 2 diabetes mellitus with diabetic peripheral angiopathy without gangrene: Secondary | ICD-10-CM | POA: Diagnosis not present

## 2023-07-07 DIAGNOSIS — I160 Hypertensive urgency: Secondary | ICD-10-CM | POA: Diagnosis not present

## 2023-07-07 DIAGNOSIS — F32A Depression, unspecified: Secondary | ICD-10-CM | POA: Diagnosis not present

## 2023-07-07 DIAGNOSIS — F419 Anxiety disorder, unspecified: Secondary | ICD-10-CM | POA: Diagnosis not present

## 2023-07-07 DIAGNOSIS — K449 Diaphragmatic hernia without obstruction or gangrene: Secondary | ICD-10-CM | POA: Diagnosis not present

## 2023-07-08 DIAGNOSIS — E611 Iron deficiency: Secondary | ICD-10-CM | POA: Diagnosis not present

## 2023-07-08 DIAGNOSIS — I5189 Other ill-defined heart diseases: Secondary | ICD-10-CM | POA: Diagnosis not present

## 2023-07-13 DIAGNOSIS — I5189 Other ill-defined heart diseases: Secondary | ICD-10-CM | POA: Diagnosis not present

## 2023-07-16 DIAGNOSIS — K8581 Other acute pancreatitis with uninfected necrosis: Secondary | ICD-10-CM | POA: Diagnosis not present

## 2023-07-16 DIAGNOSIS — K449 Diaphragmatic hernia without obstruction or gangrene: Secondary | ICD-10-CM | POA: Diagnosis not present

## 2023-07-16 DIAGNOSIS — I5032 Chronic diastolic (congestive) heart failure: Secondary | ICD-10-CM | POA: Diagnosis not present

## 2023-07-16 DIAGNOSIS — E1151 Type 2 diabetes mellitus with diabetic peripheral angiopathy without gangrene: Secondary | ICD-10-CM | POA: Diagnosis not present

## 2023-07-16 DIAGNOSIS — F32A Depression, unspecified: Secondary | ICD-10-CM | POA: Diagnosis not present

## 2023-07-16 DIAGNOSIS — J438 Other emphysema: Secondary | ICD-10-CM | POA: Diagnosis not present

## 2023-07-16 DIAGNOSIS — I110 Hypertensive heart disease with heart failure: Secondary | ICD-10-CM | POA: Diagnosis not present

## 2023-07-16 DIAGNOSIS — F419 Anxiety disorder, unspecified: Secondary | ICD-10-CM | POA: Diagnosis not present

## 2023-07-16 DIAGNOSIS — I160 Hypertensive urgency: Secondary | ICD-10-CM | POA: Diagnosis not present

## 2023-07-18 ENCOUNTER — Other Ambulatory Visit: Payer: Self-pay | Admitting: Interventional Cardiology

## 2023-07-20 ENCOUNTER — Other Ambulatory Visit: Payer: Self-pay | Admitting: Interventional Cardiology

## 2023-07-20 DIAGNOSIS — I110 Hypertensive heart disease with heart failure: Secondary | ICD-10-CM

## 2023-07-21 DIAGNOSIS — K8581 Other acute pancreatitis with uninfected necrosis: Secondary | ICD-10-CM | POA: Diagnosis not present

## 2023-07-21 DIAGNOSIS — I110 Hypertensive heart disease with heart failure: Secondary | ICD-10-CM | POA: Diagnosis not present

## 2023-07-21 DIAGNOSIS — J438 Other emphysema: Secondary | ICD-10-CM | POA: Diagnosis not present

## 2023-07-21 DIAGNOSIS — K449 Diaphragmatic hernia without obstruction or gangrene: Secondary | ICD-10-CM | POA: Diagnosis not present

## 2023-07-21 DIAGNOSIS — E1151 Type 2 diabetes mellitus with diabetic peripheral angiopathy without gangrene: Secondary | ICD-10-CM | POA: Diagnosis not present

## 2023-07-21 DIAGNOSIS — I5032 Chronic diastolic (congestive) heart failure: Secondary | ICD-10-CM | POA: Diagnosis not present

## 2023-07-21 DIAGNOSIS — F419 Anxiety disorder, unspecified: Secondary | ICD-10-CM | POA: Diagnosis not present

## 2023-07-21 DIAGNOSIS — F32A Depression, unspecified: Secondary | ICD-10-CM | POA: Diagnosis not present

## 2023-07-21 DIAGNOSIS — I160 Hypertensive urgency: Secondary | ICD-10-CM | POA: Diagnosis not present

## 2023-07-27 DIAGNOSIS — E1151 Type 2 diabetes mellitus with diabetic peripheral angiopathy without gangrene: Secondary | ICD-10-CM | POA: Diagnosis not present

## 2023-07-27 DIAGNOSIS — J438 Other emphysema: Secondary | ICD-10-CM | POA: Diagnosis not present

## 2023-07-27 DIAGNOSIS — F419 Anxiety disorder, unspecified: Secondary | ICD-10-CM | POA: Diagnosis not present

## 2023-07-27 DIAGNOSIS — I110 Hypertensive heart disease with heart failure: Secondary | ICD-10-CM | POA: Diagnosis not present

## 2023-07-27 DIAGNOSIS — K449 Diaphragmatic hernia without obstruction or gangrene: Secondary | ICD-10-CM | POA: Diagnosis not present

## 2023-07-27 DIAGNOSIS — F32A Depression, unspecified: Secondary | ICD-10-CM | POA: Diagnosis not present

## 2023-07-27 DIAGNOSIS — K8581 Other acute pancreatitis with uninfected necrosis: Secondary | ICD-10-CM | POA: Diagnosis not present

## 2023-07-27 DIAGNOSIS — I5032 Chronic diastolic (congestive) heart failure: Secondary | ICD-10-CM | POA: Diagnosis not present

## 2023-07-27 DIAGNOSIS — I160 Hypertensive urgency: Secondary | ICD-10-CM | POA: Diagnosis not present

## 2023-08-03 DIAGNOSIS — J449 Chronic obstructive pulmonary disease, unspecified: Secondary | ICD-10-CM | POA: Diagnosis not present

## 2023-08-03 DIAGNOSIS — G4733 Obstructive sleep apnea (adult) (pediatric): Secondary | ICD-10-CM | POA: Diagnosis not present

## 2023-08-06 DIAGNOSIS — J438 Other emphysema: Secondary | ICD-10-CM | POA: Diagnosis not present

## 2023-08-06 DIAGNOSIS — K449 Diaphragmatic hernia without obstruction or gangrene: Secondary | ICD-10-CM | POA: Diagnosis not present

## 2023-08-06 DIAGNOSIS — F419 Anxiety disorder, unspecified: Secondary | ICD-10-CM | POA: Diagnosis not present

## 2023-08-06 DIAGNOSIS — E1151 Type 2 diabetes mellitus with diabetic peripheral angiopathy without gangrene: Secondary | ICD-10-CM | POA: Diagnosis not present

## 2023-08-06 DIAGNOSIS — I5032 Chronic diastolic (congestive) heart failure: Secondary | ICD-10-CM | POA: Diagnosis not present

## 2023-08-06 DIAGNOSIS — I160 Hypertensive urgency: Secondary | ICD-10-CM | POA: Diagnosis not present

## 2023-08-06 DIAGNOSIS — K8581 Other acute pancreatitis with uninfected necrosis: Secondary | ICD-10-CM | POA: Diagnosis not present

## 2023-08-06 DIAGNOSIS — F32A Depression, unspecified: Secondary | ICD-10-CM | POA: Diagnosis not present

## 2023-08-06 DIAGNOSIS — I110 Hypertensive heart disease with heart failure: Secondary | ICD-10-CM | POA: Diagnosis not present

## 2023-08-10 DIAGNOSIS — R490 Dysphonia: Secondary | ICD-10-CM | POA: Diagnosis not present

## 2023-08-10 DIAGNOSIS — R1314 Dysphagia, pharyngoesophageal phase: Secondary | ICD-10-CM | POA: Diagnosis not present

## 2023-08-10 DIAGNOSIS — R053 Chronic cough: Secondary | ICD-10-CM | POA: Diagnosis not present

## 2023-08-10 DIAGNOSIS — J384 Edema of larynx: Secondary | ICD-10-CM | POA: Diagnosis not present

## 2023-08-12 DIAGNOSIS — D649 Anemia, unspecified: Secondary | ICD-10-CM | POA: Diagnosis not present

## 2023-08-12 DIAGNOSIS — E890 Postprocedural hypothyroidism: Secondary | ICD-10-CM | POA: Diagnosis not present

## 2023-08-12 DIAGNOSIS — I10 Essential (primary) hypertension: Secondary | ICD-10-CM | POA: Diagnosis not present

## 2023-08-12 DIAGNOSIS — E782 Mixed hyperlipidemia: Secondary | ICD-10-CM | POA: Diagnosis not present

## 2023-08-12 DIAGNOSIS — I5189 Other ill-defined heart diseases: Secondary | ICD-10-CM | POA: Diagnosis not present

## 2023-08-12 DIAGNOSIS — E119 Type 2 diabetes mellitus without complications: Secondary | ICD-10-CM | POA: Diagnosis not present

## 2023-08-12 DIAGNOSIS — J439 Emphysema, unspecified: Secondary | ICD-10-CM | POA: Diagnosis not present

## 2023-08-12 DIAGNOSIS — Z87891 Personal history of nicotine dependence: Secondary | ICD-10-CM | POA: Diagnosis not present

## 2023-08-21 DIAGNOSIS — R1310 Dysphagia, unspecified: Secondary | ICD-10-CM | POA: Diagnosis not present

## 2023-08-21 DIAGNOSIS — R053 Chronic cough: Secondary | ICD-10-CM | POA: Diagnosis not present

## 2024-03-17 ENCOUNTER — Emergency Department (HOSPITAL_BASED_OUTPATIENT_CLINIC_OR_DEPARTMENT_OTHER)
Admission: EM | Admit: 2024-03-17 | Discharge: 2024-03-17 | Disposition: A | Discharge status: Home / Self Care | Attending: Emergency Medicine | Admitting: Emergency Medicine

## 2024-03-17 ENCOUNTER — Other Ambulatory Visit: Payer: Self-pay

## 2024-03-17 ENCOUNTER — Emergency Department (HOSPITAL_BASED_OUTPATIENT_CLINIC_OR_DEPARTMENT_OTHER)

## 2024-03-17 ENCOUNTER — Encounter (HOSPITAL_BASED_OUTPATIENT_CLINIC_OR_DEPARTMENT_OTHER): Payer: Self-pay | Admitting: Emergency Medicine

## 2024-03-17 DIAGNOSIS — E039 Hypothyroidism, unspecified: Secondary | ICD-10-CM | POA: Insufficient documentation

## 2024-03-17 DIAGNOSIS — I10 Essential (primary) hypertension: Secondary | ICD-10-CM | POA: Diagnosis not present

## 2024-03-17 DIAGNOSIS — Z7984 Long term (current) use of oral hypoglycemic drugs: Secondary | ICD-10-CM | POA: Diagnosis not present

## 2024-03-17 DIAGNOSIS — Z79899 Other long term (current) drug therapy: Secondary | ICD-10-CM | POA: Diagnosis not present

## 2024-03-17 DIAGNOSIS — Z7982 Long term (current) use of aspirin: Secondary | ICD-10-CM | POA: Diagnosis not present

## 2024-03-17 DIAGNOSIS — S6991XA Unspecified injury of right wrist, hand and finger(s), initial encounter: Secondary | ICD-10-CM | POA: Diagnosis present

## 2024-03-17 DIAGNOSIS — W010XXA Fall on same level from slipping, tripping and stumbling without subsequent striking against object, initial encounter: Secondary | ICD-10-CM | POA: Diagnosis not present

## 2024-03-17 DIAGNOSIS — S62646A Nondisplaced fracture of proximal phalanx of right little finger, initial encounter for closed fracture: Secondary | ICD-10-CM | POA: Diagnosis not present

## 2024-03-17 DIAGNOSIS — E119 Type 2 diabetes mellitus without complications: Secondary | ICD-10-CM | POA: Insufficient documentation

## 2024-03-17 NOTE — Discharge Instructions (Addendum)
 You do have a small fracture of your finger.  You can tape your finger up for the next 2 weeks regularly and then see how it feels.  If it is still very painful and swollen take it for another few weeks.  There is nothing else that should need to be done and it should heal on its own.

## 2024-03-17 NOTE — ED Triage Notes (Signed)
 Pt reports mechanical fall last night, c/o ongoing R little finger pain and difficulty with movement.   Denies head injury.   Took tylenol this AM appx 1100.

## 2024-03-17 NOTE — ED Provider Notes (Signed)
 " [place] [facility] Provider Note   CSN: [id number] Arrival date & time: 03/17/24  1423     Patient presents with: Finger Injury   Haley Sanders is a 89 y.o. female.   Patient is an 89 year old female with a history of diabetes, hypertension, hypothyroidism who is presenting today for evaluation after a fall.  Haley Sanders reports that Haley Sanders tripped last night when Haley Sanders was going to bed and fell on her right side.  Haley Sanders fell onto carpet and denies hitting her head or loss of consciousness.  Her biggest complaint is ongoing pain in her right fifth finger.  Haley Sanders reports in the past Haley Sanders has broken that finger and it has always been deviated laterally and slightly bent but now it is more swollen and painful.  Haley Sanders denies any neck pain, shoulder pain, hip or back pain different from her chronic pain.  Haley Sanders has been able to ambulate and reports Haley Sanders was able to get up after the fall.  Haley Sanders denies feeling unwell prior to falling or after.  The history is provided by the patient.       Prior to Admission medications  Medication Sig Start Date End Date Taking? Authorizing Provider  acetaminophen (TYLENOL) 650 MG CR tablet Take 650 mg by mouth every 8 (eight) hours as needed for pain.    [provider]  amLODipine  (NORVASC ) 5 MG tablet TAKE 1 TABLET TWICE DAILY 07/23/23   Varanasi, Jayadeep S, MD  aspirin 81 MG tablet Take 81 mg by mouth daily.     [provider]  atorvastatin (LIPITOR) 40 MG tablet Take 40 mg by mouth daily.    [provider]  buPROPion (WELLBUTRIN SR) 150 MG 12 hr tablet Take 150 mg by mouth 2 (two) times daily. 01/25/20   [provider]  cloNIDine  (CATAPRES ) 0.2 MG tablet Take 0.2 mg by mouth 2 (two) times daily.    [provider]  donepezil (ARICEPT) 10 MG tablet Take 10 mg by mouth at bedtime.    [provider]  esomeprazole (NEXIUM) 40 MG capsule Take 40 mg by mouth 2 (two) times daily before a  meal.     [provider]  FLUoxetine (PROZAC) 40 MG capsule Take 40 mg by mouth daily.    [provider]  hydrochlorothiazide  (HYDRODIURIL ) 25 MG tablet Take 1 tablet (25 mg total) by mouth daily for 30 days. 04/10/18 06/18/22  Trine Raynell Moder, MD  JANUVIA 100 MG tablet Take 100 mg by mouth daily. 06/16/22   [provider]  levothyroxine (SYNTHROID) 88 MCG tablet Take 88 mcg by mouth daily before breakfast. 11/19/17   [provider]  melatonin 5 MG TABS Take 5 mg by mouth at bedtime as needed.    [provider]  Mouthwashes (BIOTENE/CALCIUM PBF) LIQD Take one tablespoon by mouth (15ml) and rinse solution for 30 seconds then spit out 03/29/18   Ruthell Lauraine FALCON, NP  Multiple Vitamins-Minerals (MULTIVITAMIN WITH MINERALS) tablet Take 1 tablet by mouth daily.    [provider]  ONE TOUCH ULTRA TEST test strip 1 each by Other route as needed for other.  11/16/15   [provider]  psyllium (REGULOID) 0.52 G capsule Take 0.52 g by mouth 3 (three) times daily.    [provider]  repaglinide  (PRANDIN ) 0.5 MG tablet Take 0.5 tablets (0.25 mg total) by mouth 2 (two) times daily before a meal. 04/23/21   Kassie Mallick, MD  senna (SENOKOT) 8.6 MG TABS tablet Take 1 tablet by mouth daily.    [provider]  solifenacin (VESICARE) 5 MG tablet Take 5 mg by mouth daily. 04/18/20   [provider]  sucralfate (CARAFATE) 1 g tablet Take 1 g by mouth 3 (three) times daily as needed (stomach acid before meals). 02/28/20   [provider]  valsartan  (DIOVAN ) 160 MG tablet TAKE 1 TABLET EVERY DAY 07/21/23   Dann Candyce RAMAN, MD    Allergies: Codeine, Grass extracts [gramineae pollens], and Mold extract [trichophyton]    Review of Systems  Updated Vital Signs BP (!) 150/77 (BP Location: Right Arm)   Pulse 67   Temp 98 F (36.7 C)   Resp 18   Ht 5' (1.524 m)   Wt 65.8 kg   SpO2 95%   BMI 28.32 kg/m    Physical Exam Vitals reviewed.  HENT:     Head: Normocephalic.  Cardiovascular:     Rate and Rhythm: Normal rate.     Pulses: Normal pulses.  Pulmonary:     Effort: Pulmonary effort is normal.  Musculoskeletal:        General: Tenderness and signs of injury present. Normal range of motion.     Right shoulder: Normal.     Right elbow: Normal.     Right wrist: Normal.     Right hand: Tenderness and bony tenderness present.       Hands:     Cervical back: Normal range of motion and neck supple. No tenderness.  Neurological:     Mental Status: Haley Sanders is alert. Mental status is at baseline.     (all labs ordered are listed, but only abnormal results are displayed) Labs Reviewed - No data to display  EKG: None  Radiology: DG Finger Little Right Result Date: 03/17/2024 EXAM: 3 VIEW(S) XRAY OF THE FINGER(S) 03/17/2024 02:43:00 PM COMPARISON: None available. CLINICAL HISTORY: fall with injury FINDINGS: BONES AND JOINTS: Comminuted fracture at the base of the fifth digit middle phalanx. Extension to the PIP joint proximal articular surface of the PIP joint. No malalignment. Osteoarthritis present. SOFT TISSUES: Mild soft tissue swelling about the digit. IMPRESSION: 1. Comminuted fracture at the base of the fifth digit middle phalanx with extension to the proximal articular surface at the PIP joint. Electronically signed by: Rogelia Myers MD 03/17/2024 02:51 PM EST RP Workstation: [id number]     Procedures   Medications Ordered in the ED - No data to display                                  Medical Decision Making Amount and/or Complexity of Data Reviewed Radiology: ordered.   Patient presenting today after a fall last night.  Ongoing pain to the right fifth finger and concern for possible fracture.  No other obvious signs of injury today.  I have independently visualized and interpreted pt's images today.  X-ray shows a comminuted fracture at the base of the fifth digit middle  phalanx with extension into the joint.  Patient reports injury to that finger which has left it partially mobile and laterally deviated.  Suspicion for other areas of injury at this time.  Patient's fingers were buddy taped together and Haley Sanders was given follow-up precautions.  Haley Sanders denied any head injury and mentating normally at this time.      Final diagnoses:  Closed nondisplaced fracture of proximal phalanx of right little  finger, initial encounter    ED Discharge Orders     None          Doretha Folks, MD 03/17/24 1519  "
